# Patient Record
Sex: Male | Born: 1950 | Race: White | Hispanic: No | Marital: Married | Smoking: Never smoker
Health system: Southern US, Community
[De-identification: ages and names within clinical notes are randomized; demographics above are authoritative.]

## PROBLEM LIST (undated history)

## (undated) DIAGNOSIS — N135 Crossing vessel and stricture of ureter without hydronephrosis: Secondary | ICD-10-CM

## (undated) DIAGNOSIS — Z5189 Encounter for other specified aftercare: Secondary | ICD-10-CM

## (undated) DIAGNOSIS — M353 Polymyalgia rheumatica: Secondary | ICD-10-CM

## (undated) DIAGNOSIS — IMO0002 Reserved for concepts with insufficient information to code with codable children: Secondary | ICD-10-CM

## (undated) DIAGNOSIS — N200 Calculus of kidney: Secondary | ICD-10-CM

## (undated) DIAGNOSIS — N183 Chronic kidney disease, stage 3 unspecified: Secondary | ICD-10-CM

## (undated) HISTORY — DX: Calculus of kidney: N20.0

## (undated) HISTORY — DX: Crossing vessel and stricture of ureter without hydronephrosis: N13.5

## (undated) HISTORY — PX: OTHER SURGICAL HISTORY: SHX169

## (undated) HISTORY — DX: Chronic kidney disease, stage 3 unspecified: N18.30

## (undated) HISTORY — PX: KNEE SURGERY: SHX244

## (undated) HISTORY — PX: SHOULDER SURGERY: SHX246

## (undated) HISTORY — DX: Encounter for other specified aftercare: Z51.89

## (undated) HISTORY — DX: Chronic kidney disease, stage 3 (moderate): N18.3

## (undated) HISTORY — PX: HERNIA REPAIR: SHX51

## (undated) HISTORY — PX: URETER SURGERY: SHX823

## (undated) HISTORY — PX: COLONOSCOPY: SHX174

## (undated) HISTORY — DX: Reserved for concepts with insufficient information to code with codable children: IMO0002

## (undated) HISTORY — DX: Polymyalgia rheumatica: M35.3

## (undated) HISTORY — PX: TRIGGER FINGER RELEASE: SHX641

---

## 1998-04-30 ENCOUNTER — Encounter: Payer: Self-pay | Admitting: Urology

## 1998-05-03 ENCOUNTER — Ambulatory Visit (HOSPITAL_COMMUNITY): Admission: RE | Admit: 1998-05-03 | Discharge: 1998-05-03 | Payer: Self-pay | Admitting: Urology

## 2001-03-14 ENCOUNTER — Ambulatory Visit (HOSPITAL_COMMUNITY): Admission: RE | Admit: 2001-03-14 | Discharge: 2001-03-14 | Payer: Self-pay | Admitting: Urology

## 2001-03-14 ENCOUNTER — Encounter: Payer: Self-pay | Admitting: Urology

## 2001-08-16 ENCOUNTER — Encounter: Payer: Self-pay | Admitting: Rheumatology

## 2001-08-16 ENCOUNTER — Encounter: Admission: RE | Admit: 2001-08-16 | Discharge: 2001-08-16 | Payer: Self-pay | Admitting: Rheumatology

## 2004-05-01 ENCOUNTER — Emergency Department (HOSPITAL_COMMUNITY): Admission: EM | Admit: 2004-05-01 | Discharge: 2004-05-01 | Payer: Self-pay | Admitting: Emergency Medicine

## 2005-03-30 ENCOUNTER — Ambulatory Visit (HOSPITAL_COMMUNITY): Admission: RE | Admit: 2005-03-30 | Discharge: 2005-03-30 | Payer: Self-pay | Admitting: Urology

## 2005-09-04 LAB — HM COLONOSCOPY

## 2006-12-06 ENCOUNTER — Emergency Department (HOSPITAL_COMMUNITY): Admission: EM | Admit: 2006-12-06 | Discharge: 2006-12-06 | Payer: Self-pay | Admitting: Emergency Medicine

## 2006-12-10 ENCOUNTER — Encounter (HOSPITAL_COMMUNITY): Admission: RE | Admit: 2006-12-10 | Discharge: 2007-03-07 | Payer: Self-pay | Admitting: Emergency Medicine

## 2008-03-22 ENCOUNTER — Emergency Department (HOSPITAL_COMMUNITY): Admission: EM | Admit: 2008-03-22 | Discharge: 2008-03-22 | Payer: Self-pay | Admitting: Emergency Medicine

## 2009-08-30 ENCOUNTER — Encounter: Admission: RE | Admit: 2009-08-30 | Discharge: 2009-08-30 | Payer: Self-pay | Admitting: Rheumatology

## 2010-01-16 ENCOUNTER — Emergency Department (HOSPITAL_COMMUNITY): Admission: EM | Admit: 2010-01-16 | Discharge: 2010-01-16 | Payer: Self-pay | Admitting: Emergency Medicine

## 2011-01-20 NOTE — Consult Note (Signed)
NAME:  David Lawson, David Lawson                            ACCOUNT NO.:  [id number]   MEDICAL RECORD NO.:  [id number]                   PATIENT TYPE:  EMS   LOCATION:  ED                                   FACILITY:  [facility]   PHYSICIAN:  Maretta Bees. Vonita Moss, M.D.             DATE OF BIRTH:  25-Sep-1950   DATE OF CONSULTATION:  05/01/2004  DATE OF DISCHARGE:                                   CONSULTATION   This 60 year old white male is a patient of Dr. Vic Blackbird and has  had some recent problems with prostatitis and has been on Cipro.  He was  febrile and had a lot of dysuria earlier in the week.  Those symptoms are  clearing but he is having a very difficult time voiding at this time with  just small amounts and very small volumes and he has significant suprapubic  pressure and fullness.   Apparently he has had a history of mild renal insufficiency and that  actually may be a little bit worse since his wife said his kidney function  was 50% of normal.   He has a posterior history of gout.  Allopurinol is his only regular  medication although he is on Cipro at the present time.   ALLERGIES:  SULFA.   EXAMINATION:  He is alert and oriented but in distress, his lower abdomen is  distended, consistent with a palpable bladder.  Penis, urethra, meatus,  testes and epididymis without lesions.   He was catheterized for 1200 cc of urine.   IMPRESSION:  Acute urinary retention secondary to prostatitis.   PLAN:  He will go home with a Foley and remove it tomorrow morning.  Continue on Allopurinol and Cipro.  I will give him Vicodin for pain and  discomfort.  Will start him on Flomax 0.4 mg daily.   I told him to come to the office tomorrow if he cannot void after he takes  out his Foley but otherwise see Dr. Aldean Ast and follow up in the next week  or two.                                               Maretta Bees. Vonita Moss, M.D.    LJP/MEDQ  D:  05/01/2004  T:  05/02/2004  Job:  [id number]

## 2011-03-29 ENCOUNTER — Encounter: Payer: Self-pay | Admitting: Family Medicine

## 2011-03-29 DIAGNOSIS — M109 Gout, unspecified: Secondary | ICD-10-CM | POA: Insufficient documentation

## 2011-03-29 DIAGNOSIS — N200 Calculus of kidney: Secondary | ICD-10-CM | POA: Insufficient documentation

## 2011-03-29 DIAGNOSIS — N179 Acute kidney failure, unspecified: Secondary | ICD-10-CM | POA: Insufficient documentation

## 2011-03-29 DIAGNOSIS — N135 Crossing vessel and stricture of ureter without hydronephrosis: Secondary | ICD-10-CM | POA: Insufficient documentation

## 2013-07-29 ENCOUNTER — Other Ambulatory Visit: Payer: 59

## 2013-07-29 DIAGNOSIS — Z0000 Encounter for general adult medical examination without abnormal findings: Secondary | ICD-10-CM

## 2013-07-29 LAB — CBC WITH DIFFERENTIAL/PLATELET
HCT: 41.6 % (ref 39.0–52.0)
Hemoglobin: 14.1 g/dL (ref 13.0–17.0)
Lymphocytes Relative: 34 % (ref 12–46)
Lymphs Abs: 1.7 10*3/uL (ref 0.7–4.0)
MCV: 92.9 fL (ref 78.0–100.0)
Monocytes Absolute: 0.4 10*3/uL (ref 0.1–1.0)
Neutrophils Relative %: 55 % (ref 43–77)
RBC: 4.48 MIL/uL (ref 4.22–5.81)

## 2013-07-29 LAB — COMPREHENSIVE METABOLIC PANEL
ALT: 17 U/L (ref 0–53)
AST: 24 U/L (ref 0–37)
Albumin: 4.4 g/dL (ref 3.5–5.2)
Calcium: 9.7 mg/dL (ref 8.4–10.5)
Creat: 2.14 mg/dL — ABNORMAL HIGH (ref 0.50–1.35)
Total Bilirubin: 0.6 mg/dL (ref 0.3–1.2)
Total Protein: 6.4 g/dL (ref 6.0–8.3)

## 2013-07-29 LAB — LIPID PANEL
HDL: 38 mg/dL — ABNORMAL LOW (ref >39)
LDL Cholesterol: 102 mg/dL — ABNORMAL HIGH (ref 0–99)
Total CHOL/HDL Ratio: 4.2 Ratio
Triglycerides: 106 mg/dL (ref <150)

## 2013-07-29 LAB — PSA: PSA: 0.98 ng/mL (ref <=4.00)

## 2013-08-01 ENCOUNTER — Encounter: Payer: Self-pay | Admitting: Family Medicine

## 2013-08-01 ENCOUNTER — Encounter (INDEPENDENT_AMBULATORY_CARE_PROVIDER_SITE_OTHER): Payer: Self-pay

## 2013-08-01 ENCOUNTER — Ambulatory Visit (INDEPENDENT_AMBULATORY_CARE_PROVIDER_SITE_OTHER): Payer: 59 | Admitting: Family Medicine

## 2013-08-01 VITALS — BP 110/70 | HR 60 | Temp 97.1°F | Resp 14 | Ht 67.0 in | Wt 178.0 lb

## 2013-08-01 DIAGNOSIS — Z0000 Encounter for general adult medical examination without abnormal findings: Secondary | ICD-10-CM

## 2013-08-01 DIAGNOSIS — N183 Chronic kidney disease, stage 3 unspecified: Secondary | ICD-10-CM

## 2013-08-01 DIAGNOSIS — M109 Gout, unspecified: Secondary | ICD-10-CM

## 2013-08-01 MED ORDER — INDOMETHACIN 50 MG PO CAPS: 50.0000 mg | ORAL_CAPSULE | Freq: Two times a day (BID) | ORAL | Status: DC

## 2013-08-01 NOTE — Progress Notes (Signed)
Subjective:    Patient ID: David Lawson, male    DOB: [birth year], 62 y.o.   MRN: [id number]  HPI Patient is a very pleasant 62 year old white male who comes in today for complete physical exam. He has no complaints or concerns. His most recent labwork as listed below: Lab on 07/29/2013  Component Date Value Range Status  . WBC 07/29/2013 4.9  4.0 - 10.5 K/uL Final  . RBC 07/29/2013 4.48  4.22 - 5.81 MIL/uL Final  . Hemoglobin 07/29/2013 14.1  13.0 - 17.0 g/dL Final  . HCT 40/98/1191 41.6  39.0 - 52.0 % Final  . MCV 07/29/2013 92.9  78.0 - 100.0 fL Final  . MCH 07/29/2013 31.5  26.0 - 34.0 pg Final  . MCHC 07/29/2013 33.9  30.0 - 36.0 g/dL Final  . RDW 47/82/9562 13.5  11.5 - 15.5 % Final  . Platelets 07/29/2013 182  150 - 400 K/uL Final  . Neutrophils Relative % 07/29/2013 55  43 - 77 % Final  . Neutro Abs 07/29/2013 2.7  1.7 - 7.7 K/uL Final  . Lymphocytes Relative 07/29/2013 34  12 - 46 % Final  . Lymphs Abs 07/29/2013 1.7  0.7 - 4.0 K/uL Final  . Monocytes Relative 07/29/2013 8  3 - 12 % Final  . Monocytes Absolute 07/29/2013 0.4  0.1 - 1.0 K/uL Final  . Eosinophils Relative 07/29/2013 3  0 - 5 % Final  . Eosinophils Absolute 07/29/2013 0.2  0.0 - 0.7 K/uL Final  . Basophils Relative 07/29/2013 0  0 - 1 % Final  . Basophils Absolute 07/29/2013 0.0  0.0 - 0.1 K/uL Final  . Smear Review 07/29/2013 Criteria for review not met   Final  . Sodium 07/29/2013 143  135 - 145 mEq/L Final  . Potassium 07/29/2013 5.9* 3.5 - 5.3 mEq/L Final   No visible hemolysis.  . Chloride 07/29/2013 106  96 - 112 mEq/L Final  . CO2 07/29/2013 26  19 - 32 mEq/L Final  . Glucose, Bld 07/29/2013 96  70 - 99 mg/dL Final  . BUN 13/04/6577 23  6 - 23 mg/dL Final  . Creat 46/96/2952 2.14* 0.50 - 1.35 mg/dL Final  . Total Bilirubin 07/29/2013 0.6  0.3 - 1.2 mg/dL Final  . Alkaline Phosphatase 07/29/2013 75  39 - 117 U/L Final  . AST 07/29/2013 24  0 - 37 U/L Final  . ALT 07/29/2013 17  0 - 53 U/L Final  .  Total Protein 07/29/2013 6.4  6.0 - 8.3 g/dL Final  . Albumin 84/13/2440 4.4  3.5 - 5.2 g/dL Final  . Calcium 07/01/2535 9.7  8.4 - 10.5 mg/dL Final  . Cholesterol 64/40/3474 161  0 - 200 mg/dL Final   Comment: ATP III Classification:                                < 200        mg/dL        Desirable                               200 - 239     mg/dL        Borderline High                               >=  240        mg/dL        High                             . Triglycerides 07/29/2013 106  <150 mg/dL Final  . HDL 40/98/1191 38* >39 mg/dL Final  . Total CHOL/HDL Ratio 07/29/2013 4.2   Final  . VLDL 07/29/2013 21  0 - 40 mg/dL Final  . LDL Cholesterol 07/29/2013 102* 0 - 99 mg/dL Final   Comment:                            Total Cholesterol/HDL Ratio:CHD Risk                                                 Coronary Heart Disease Risk Table                                                                 Men       Women                                   1/2 Average Risk              3.4        3.3                                       Average Risk              5.0        4.4                                    2X Average Risk              9.6        7.1                                    3X Average Risk             23.4       11.0                          Use the calculated Patient Ratio above and the CHD Risk table                           to determine the patient's CHD Risk.                          ATP III Classification (LDL):                                <  100        mg/dL         Optimal                               100 - 129     mg/dL         Near or Above Optimal                               130 - 159     mg/dL         Borderline High                               160 - 189     mg/dL         High                                > 190        mg/dL         Very High                             . PSA 07/29/2013 0.98  <=4.00 ng/mL Final   Comment: Test Methodology: ECLIA PSA  (Electrochemiluminescence Immunoassay)                                                     For PSA values from 2.5-4.0, particularly in younger men <60 years                          old, the AUA and NCCN suggest testing for % Free PSA (3515) and                          evaluation of the rate of increase in PSA (PSA velocity).   patient's labs are excellent except for a creatinine that is elevated at 2.14 and potassium is elevated at 5.9. Upon further questioning the patient he had a history of ureteral obstruction as a child that required surgery. 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  He has since had chronic kidney disease. This caused gallop as an adult. In the past he was followed by urology but no one has been checking his creatinine since. His colonoscopy was in 2007. He had a tetanus shot 7 years ago. He refuses a flu shot today. He is due for the Zostavax. Past Medical History  Diagnosis Date  . PMR (polymyalgia rheumatica)   . DDD (degenerative disc disease)   . Acute renal failure   . Gout   . Nephrolithiasis   . Ureteral obstruction    No past surgical history on file. Current Outpatient Prescriptions on File Prior to Visit  Medication Sig Dispense Refill  . allopurinol (ZYLOPRIM) 100 MG tablet Take 200 mg by mouth daily.        Marland Kitchen glucosamine-chondroitin 500-400 MG tablet Take 1 tablet by mouth 3 (three) times daily.        Marland Kitchen  Multiple Vitamins-Minerals (MULTIVITAMIN,TX-MINERALS) tablet Take 1 tablet by mouth daily.         No current facility-administered medications on file prior to visit.   Allergies  Allergen Reactions  . Sulfa Antibiotics    History   Social History  . Marital Status: Married    Spouse Name: N/A    Number of Children: N/A  . Years of Education: N/A   Occupational History  . Not on file.   Social History Main Topics  . Smoking status: Never Smoker   . Smokeless tobacco: Not on file  . Alcohol  Use: No  . Drug Use: No  . Sexual Activity: Not on file   Other Topics Concern  . Not on file   Social History Narrative  . No narrative on file   Family history significant for father with coronary artery disease and a family history of coronary artery disease on his father's side.    Review of Systems  All other systems reviewed and are negative.       Objective:   Physical Exam  Vitals reviewed. Constitutional: He is oriented to person, place, and time. He appears well-developed and well-nourished. No distress.  HENT:  Head: Normocephalic and atraumatic.  Right Ear: External ear normal.  Left Ear: External ear normal.  Nose: Nose normal.  Mouth/Throat: Oropharynx is clear and moist. No oropharyngeal exudate.  Eyes: Conjunctivae and EOM are normal. Pupils are equal, round, and reactive to light. Right eye exhibits no discharge. Left eye exhibits no discharge. No scleral icterus.  Neck: Normal range of motion. Neck supple. No JVD present. No tracheal deviation present. No thyromegaly present.  Cardiovascular: Normal rate, regular rhythm, normal heart sounds and intact distal pulses.  Exam reveals no gallop and no friction rub.   No murmur heard. Pulmonary/Chest: Effort normal and breath sounds normal. No stridor. No respiratory distress. He has no wheezes. He has no rales. He exhibits no tenderness.  Abdominal: Soft. Bowel sounds are normal. He exhibits no distension and no mass. There is no tenderness. There is no rebound and no guarding.  Musculoskeletal: Normal range of motion. He exhibits no edema and no tenderness.  Lymphadenopathy:    He has no cervical adenopathy.  Neurological: He is alert and oriented to person, place, and time. He has normal reflexes. He displays normal reflexes. No cranial nerve deficit. He exhibits normal muscle tone. Coordination normal.  Skin: Skin is warm. No rash noted. He is not diaphoretic. No erythema. No pallor.  Psychiatric: He has a  normal mood and affect. His behavior is normal. Judgment and thought content normal.          Assessment & Plan:  1. Routine general medical examination at a health care facility Remainder of the patient's physical exam is completely normal. His preventive care is up to date. I recommended the Zostavax and gave the patient prescription to get his local pharmacy. I offered patient flu shot but he refused.  Patient is due for the pneumonia vaccine at age 24.  2. Gout I refilled the patient's Indocin with strict instructions not to take more than one to 2 times year due to his chronic kidney disease. He gets it more frequently we'll need to change his allopurinol to the lower - indomethacin (INDOCIN) 50 MG capsule; Take 1 capsule (50 mg total) by mouth 2 (two) times daily with a meal. prn  Dispense: 30 capsule; Refill: 0  3. CKD (chronic kidney disease), stage III Check a renal  ultrasound. If the renal ultrasound confirms chronic kidney disease from medical renal issues, I would check his creatinine every 6 months. - US Renal; Future

## 2013-08-06 ENCOUNTER — Other Ambulatory Visit: Payer: Self-pay

## 2013-08-06 ENCOUNTER — Encounter: Payer: Self-pay | Admitting: Family Medicine

## 2013-08-06 NOTE — Telephone Encounter (Signed)
This encounter was created in error - please disregard.

## 2013-08-06 NOTE — Telephone Encounter (Signed)
David Lawson from [facility] called and stated that the pt cancelled his apt for today bc of scheduling issues and will call back and schedule his apt

## 2013-08-07 ENCOUNTER — Encounter: Payer: Self-pay | Admitting: *Deleted

## 2013-08-15 ENCOUNTER — Ambulatory Visit
Admission: RE | Admit: 2013-08-15 | Discharge: 2013-08-15 | Disposition: A | Discharge status: Home / Self Care | Payer: 59 | Source: Ambulatory Visit | Attending: Family Medicine | Admitting: Family Medicine

## 2013-08-15 DIAGNOSIS — N183 Chronic kidney disease, stage 3 unspecified: Secondary | ICD-10-CM

## 2013-08-19 ENCOUNTER — Other Ambulatory Visit: Payer: Self-pay | Admitting: Family Medicine

## 2013-08-19 DIAGNOSIS — M109 Gout, unspecified: Secondary | ICD-10-CM

## 2013-08-19 MED ORDER — INDOMETHACIN 50 MG PO CAPS: 50.0000 mg | ORAL_CAPSULE | Freq: Two times a day (BID) | ORAL | Status: DC

## 2014-06-04 ENCOUNTER — Ambulatory Visit (INDEPENDENT_AMBULATORY_CARE_PROVIDER_SITE_OTHER): Payer: 59 | Admitting: Family Medicine

## 2014-06-04 ENCOUNTER — Encounter: Payer: Self-pay | Admitting: Family Medicine

## 2014-06-04 VITALS — BP 100/64 | HR 62 | Temp 98.2°F | Resp 16 | Ht 67.0 in | Wt 181.0 lb

## 2014-06-04 DIAGNOSIS — B354 Tinea corporis: Secondary | ICD-10-CM

## 2014-06-04 DIAGNOSIS — M722 Plantar fascial fibromatosis: Secondary | ICD-10-CM

## 2014-06-04 MED ORDER — FLUCONAZOLE 150 MG PO TABS: 150.0000 mg | ORAL_TABLET | ORAL | Status: DC

## 2014-06-04 NOTE — Progress Notes (Signed)
   Subjective:    Patient ID: David Lawson, male    DOB: [birth year], 63 y.o.   MRN: [id number]  HPI  Patient has a large red rash in his pubic area. It is proximally 5 cm in diameter. It has a raised red border with central clearing and white scale. It looks to be ringworm. He's been trying Goldbond powder and talcum powder without benefit. He states that the rash itches. Patient also has had pain in his right foot near the insertion of the plantar fashion on his right heel for approximately one month. He denies any specific injury. The pain is worse in the morning first 5 steps that he takes and then improves as he walks on the heel. He is tender to palpation on the plantar aspect of the calcaneus near the insertion of the plantar fashia. Past Medical History  Diagnosis Date  . PMR (polymyalgia rheumatica)   . DDD (degenerative disc disease)   . Acute renal failure   . Gout   . Nephrolithiasis   . Ureteral obstruction    No past surgical history on file. Current Outpatient Prescriptions on File Prior to Visit  Medication Sig Dispense Refill  . allopurinol (ZYLOPRIM) 100 MG tablet Take 200 mg by mouth daily.        Marland Kitchen glucosamine-chondroitin 500-400 MG tablet Take 1 tablet by mouth 3 (three) times daily.        . indomethacin (INDOCIN) 50 MG capsule Take 1 capsule (50 mg total) by mouth 2 (two) times daily with a meal. prn  30 capsule  0  . Multiple Vitamins-Minerals (MULTIVITAMIN,TX-MINERALS) tablet Take 1 tablet by mouth daily.         No current facility-administered medications on file prior to visit.   Allergies  Allergen Reactions  . Sulfa Antibiotics    History   Social History  . Marital Status: Married    Spouse Name: N/A    Number of Children: N/A  . Years of Education: N/A   Occupational History  . Not on file.   Social History Main Topics  . Smoking status: Never Smoker   . Smokeless tobacco: Not on file  . Alcohol Use: No  . Drug Use: No  . Sexual Activity:  Not on file   Other Topics Concern  . Not on file   Social History Narrative  . No narrative on file     Review of Systems  All other systems reviewed and are negative.      Objective:   Physical Exam  Vitals reviewed. Cardiovascular: Normal rate and regular rhythm.   Pulmonary/Chest: Effort normal and breath sounds normal.  Musculoskeletal: He exhibits tenderness.       Right foot: He exhibits bony tenderness.  Skin: Rash noted. There is erythema.          Assessment & Plan:  Tinea corporis  Plantar fasciitis of right foot   Begin Lotrimin cream twice a day for the next 2-4 weeks. The patient would also like to expedite by taking Diflucan 150 mg by mouth every other day for 2 weeks. Recheck in 2 weeks if no better or sooner if worse. I believe the pain in his foot is due to plantar fasciitis. I recommended ibuprofen for the next 2 weeks. Also gave the patient a series of stretches I wanted to perform at home to try to help the pain. Also recommended over-the-counter arch supports.Marland Kitchen

## 2015-03-25 ENCOUNTER — Encounter: Payer: Self-pay | Admitting: Family Medicine

## 2015-03-25 ENCOUNTER — Ambulatory Visit (INDEPENDENT_AMBULATORY_CARE_PROVIDER_SITE_OTHER): Payer: 59 | Admitting: Family Medicine

## 2015-03-25 VITALS — BP 110/68 | HR 60 | Temp 97.9°F | Resp 14 | Ht 67.0 in | Wt 176.0 lb

## 2015-03-25 DIAGNOSIS — M1000 Idiopathic gout, unspecified site: Secondary | ICD-10-CM

## 2015-03-25 DIAGNOSIS — N183 Chronic kidney disease, stage 3 (moderate): Secondary | ICD-10-CM | POA: Diagnosis not present

## 2015-03-25 DIAGNOSIS — Z13220 Encounter for screening for lipoid disorders: Secondary | ICD-10-CM | POA: Diagnosis not present

## 2015-03-25 MED ORDER — CIPROFLOXACIN HCL 500 MG PO TABS: 500.0000 mg | ORAL_TABLET | Freq: Two times a day (BID) | ORAL | Status: DC

## 2015-03-25 MED ORDER — PREDNISONE 20 MG PO TABS: ORAL_TABLET | ORAL | Status: DC

## 2015-03-25 MED ORDER — INDOMETHACIN 50 MG PO CAPS: 50.0000 mg | ORAL_CAPSULE | Freq: Two times a day (BID) | ORAL | Status: DC

## 2015-03-25 NOTE — Progress Notes (Signed)
Subjective:    Patient ID: David Lawson, male    DOB: 1950-11-11, 64 y.o.   MRN: [id number]  HPI Patient is here today requesting a refill on Indocin. Patient uses 1-2 Indocin pills every 12 months for rare gout exacerbations. However he has chronic kidney disease stage III due to kidney damage she suffered as a child. He is also overdue for a PSA as well as a cholesterol level. Patient is also requesting a refill on Cipro that he uses maybe once every 2 years for rare prostate infection. Patient has had a shingles vaccine. His colonoscopy is not due until next year. Past Medical History  Diagnosis Date  . PMR (polymyalgia rheumatica)   . DDD (degenerative disc disease)   . Acute renal failure   . Gout   . Nephrolithiasis   . Ureteral obstruction    No past surgical history on file. Current Outpatient Prescriptions on File Prior to Visit  Medication Sig Dispense Refill  . allopurinol (ZYLOPRIM) 100 MG tablet Take 200 mg by mouth daily.      Marland Kitchen glucosamine-chondroitin 500-400 MG tablet Take 1 tablet by mouth 3 (three) times daily.      . Multiple Vitamins-Minerals (MULTIVITAMIN,TX-MINERALS) tablet Take 1 tablet by mouth daily.       No current facility-administered medications on file prior to visit.   Allergies  Allergen Reactions  . Sulfa Antibiotics    History   Social History  . Marital Status: Married    Spouse Name: N/A  . Number of Children: N/A  . Years of Education: N/A   Occupational History  . Not on file.   Social History Main Topics  . Smoking status: Never Smoker   . Smokeless tobacco: Not on file  . Alcohol Use: No  . Drug Use: No  . Sexual Activity: Not on file   Other Topics Concern  . Not on file   Social History Narrative      Review of Systems  All other systems reviewed and are negative.      Objective:   Physical Exam  Constitutional: He appears well-developed and well-nourished.  Neck: Neck supple. No JVD present. No thyromegaly  present.  Cardiovascular: Normal rate, regular rhythm and normal heart sounds.   No murmur heard. Pulmonary/Chest: Effort normal and breath sounds normal. No respiratory distress. He has no wheezes. He has no rales.  Abdominal: Soft. Bowel sounds are normal. He exhibits no distension. There is no tenderness. There is no rebound and no guarding.  Musculoskeletal: He exhibits no edema.  Lymphadenopathy:    He has no cervical adenopathy.  Vitals reviewed.         Assessment & Plan:  Idiopathic gout, unspecified chronicity, unspecified site - Plan: predniSONE (DELTASONE) 20 MG tablet, indomethacin (INDOCIN) 50 MG capsule  Screening for cholesterol level - Plan: COMPLETE METABOLIC PANEL WITH GFR, Lipid panel  CKD (chronic kidney disease), stage 3 (moderate) - Plan: CBC with Differential/Platelet, PSA  Blood pressures well controlled. I have asked the patient to return fasting for a CMP as well as fasting lipid panel. I explained to the patient I want him to try to avoid using Indocin because of his chronic kidney disease. I would actually rather have the patient take prednisone for gout exacerbation. Offer the patient switching allopurinol to Uloric but the patient declined. I did give the patient a refill on Cipro on the off chance he gets a bladder infection while he is [place] of [place].

## 2015-03-30 ENCOUNTER — Other Ambulatory Visit: Payer: 59

## 2015-05-06 ENCOUNTER — Other Ambulatory Visit: Payer: 59

## 2015-05-06 DIAGNOSIS — N183 Chronic kidney disease, stage 3 (moderate): Secondary | ICD-10-CM

## 2015-05-06 DIAGNOSIS — Z13220 Encounter for screening for lipoid disorders: Secondary | ICD-10-CM

## 2015-05-06 LAB — COMPLETE METABOLIC PANEL WITH GFR
ALT: 18 U/L (ref 9–46)
AST: 19 U/L (ref 10–35)
Albumin: 4.1 g/dL (ref 3.6–5.1)
Alkaline Phosphatase: 80 U/L (ref 40–115)
BILIRUBIN TOTAL: 0.5 mg/dL (ref 0.2–1.2)
BUN: 34 mg/dL — ABNORMAL HIGH (ref 7–25)
CO2: 21 mmol/L (ref 20–31)
CREATININE: 2.01 mg/dL — AB (ref 0.70–1.25)
Calcium: 9.3 mg/dL (ref 8.6–10.3)
Chloride: 107 mmol/L (ref 98–110)
GFR, Est African American: 40 mL/min — ABNORMAL LOW (ref >=60)
GFR, Est Non African American: 34 mL/min — ABNORMAL LOW (ref >=60)
GLUCOSE: 116 mg/dL — AB (ref 70–99)
Potassium: 5.6 mmol/L — ABNORMAL HIGH (ref 3.5–5.3)
SODIUM: 137 mmol/L (ref 135–146)
TOTAL PROTEIN: 6.3 g/dL (ref 6.1–8.1)

## 2015-05-06 LAB — CBC WITH DIFFERENTIAL/PLATELET
BASOS ABS: 0 10*3/uL (ref 0.0–0.1)
BASOS PCT: 0 % (ref 0–1)
EOS ABS: 0 10*3/uL (ref 0.0–0.7)
Eosinophils Relative: 0 % (ref 0–5)
HCT: 42.7 % (ref 39.0–52.0)
Hemoglobin: 14.5 g/dL (ref 13.0–17.0)
LYMPHS ABS: 1.5 10*3/uL (ref 0.7–4.0)
Lymphocytes Relative: 13 % (ref 12–46)
MCH: 31.8 pg (ref 26.0–34.0)
MCHC: 34 g/dL (ref 30.0–36.0)
MCV: 93.6 fL (ref 78.0–100.0)
MPV: 9.9 fL (ref 8.6–12.4)
Monocytes Absolute: 0.6 10*3/uL (ref 0.1–1.0)
Monocytes Relative: 5 % (ref 3–12)
NEUTROS PCT: 82 % — AB (ref 43–77)
Neutro Abs: 9.7 10*3/uL — ABNORMAL HIGH (ref 1.7–7.7)
PLATELETS: 226 10*3/uL (ref 150–400)
RBC: 4.56 MIL/uL (ref 4.22–5.81)
RDW: 14.1 % (ref 11.5–15.5)
WBC: 11.8 10*3/uL — ABNORMAL HIGH (ref 4.0–10.5)

## 2015-05-06 LAB — LIPID PANEL
Cholesterol: 163 mg/dL (ref 125–200)
HDL: 41 mg/dL (ref >=40)
LDL CALC: 112 mg/dL (ref <130)
Total CHOL/HDL Ratio: 4 Ratio (ref <=5.0)
Triglycerides: 49 mg/dL (ref <150)
VLDL: 10 mg/dL (ref <30)

## 2015-05-07 LAB — PSA: PSA: 1.43 ng/mL (ref <=4.00)

## 2015-08-02 ENCOUNTER — Telehealth: Payer: Self-pay | Admitting: Family Medicine

## 2015-08-02 ENCOUNTER — Other Ambulatory Visit: Payer: Self-pay | Admitting: Family Medicine

## 2015-08-02 MED ORDER — ALLOPURINOL 100 MG PO TABS: 200.0000 mg | ORAL_TABLET | Freq: Every day | ORAL | Status: DC

## 2015-08-02 NOTE — Telephone Encounter (Signed)
Patient needs refill on allopurinol 100 mg  [facility] [phone]

## 2015-08-02 NOTE — Telephone Encounter (Signed)
Medication called/sent to requested pharmacy  

## 2015-11-19 ENCOUNTER — Encounter: Payer: Self-pay | Admitting: Family Medicine

## 2015-11-19 ENCOUNTER — Ambulatory Visit (INDEPENDENT_AMBULATORY_CARE_PROVIDER_SITE_OTHER): Payer: BLUE CROSS/BLUE SHIELD | Admitting: Family Medicine

## 2015-11-19 VITALS — BP 120/68 | HR 100 | Temp 100.6°F | Resp 16 | Ht 67.0 in | Wt 180.0 lb

## 2015-11-19 DIAGNOSIS — R509 Fever, unspecified: Secondary | ICD-10-CM

## 2015-11-19 DIAGNOSIS — J101 Influenza due to other identified influenza virus with other respiratory manifestations: Secondary | ICD-10-CM | POA: Diagnosis not present

## 2015-11-19 LAB — INFLUENZA A AND B AG, IMMUNOASSAY
INFLUENZA A ANTIGEN: NOT DETECTED
Influenza B Antigen: DETECTED — AB

## 2015-11-19 MED ORDER — OSELTAMIVIR PHOSPHATE 75 MG PO CAPS: 75.0000 mg | ORAL_CAPSULE | Freq: Two times a day (BID) | ORAL | Status: DC

## 2015-11-19 NOTE — Progress Notes (Signed)
   Subjective:    Patient ID: David Lawson, male    DOB: 08-May-1951, 65 y.o.   MRN: [id number]  HPI  Symptoms began in earnest 3 days ago with diffuse body aches, fever, fatigue, mild cough, mild sore throat, mild rhinorrhea. The body aches and fatigue however her severe. The patient has no energy. His flu test today upon my evaluation appears faintly positive for influenza B Past Medical History  Diagnosis Date  . PMR (polymyalgia rheumatica) (HCC)   . DDD (degenerative disc disease)   . Acute renal failure ([place])   . Gout   . Nephrolithiasis   . Ureteral obstruction    No past surgical history on file. Current Outpatient Prescriptions on File Prior to Visit  Medication Sig Dispense Refill  . allopurinol (ZYLOPRIM) 100 MG tablet Take 2 tablets (200 mg total) by mouth daily. 180 tablet 3  . glucosamine-chondroitin 500-400 MG tablet Take 1 tablet by mouth 3 (three) times daily.      . indomethacin (INDOCIN) 50 MG capsule Take 1 capsule (50 mg total) by mouth 2 (two) times daily with a meal. Prn Gout flare 30 capsule 0  . Multiple Vitamins-Minerals (MULTIVITAMIN,TX-MINERALS) tablet Take 1 tablet by mouth daily.       No current facility-administered medications on file prior to visit.   Allergies  Allergen Reactions  . Sulfa Antibiotics    Social History   Social History  . Marital Status: Married    Spouse Name: N/A  . Number of Children: N/A  . Years of Education: N/A   Occupational History  . Not on file.   Social History Main Topics  . Smoking status: Never Smoker   . Smokeless tobacco: Not on file  . Alcohol Use: No  . Drug Use: No  . Sexual Activity: Not on file   Other Topics Concern  . Not on file   Social History Narrative      Review of Systems  All other systems reviewed and are negative.      Objective:   Physical Exam  Constitutional: He appears well-developed and well-nourished.  Neck: Neck supple. No JVD present. No thyromegaly present.    Cardiovascular: Normal rate, regular rhythm and normal heart sounds.   No murmur heard. Pulmonary/Chest: Effort normal and breath sounds normal. No respiratory distress. He has no wheezes. He has no rales.  Abdominal: Soft. Bowel sounds are normal. He exhibits no distension. There is no tenderness. There is no rebound and no guarding.  Musculoskeletal: He exhibits no edema.  Lymphadenopathy:    He has no cervical adenopathy.  Vitals reviewed.         Assessment & Plan:  Fever, unspecified - Plan: Influenza A and B Ag, Immunoassay  Influenza B - Plan: oseltamivir (TAMIFLU) 75 MG capsule, DISCONTINUED: oseltamivir (TAMIFLU) 75 MG capsule  Begin Tamiflu 75 mg by mouth twice a day for 5 days. Also gave him a refill that he can get his wife should she develop symptoms.

## 2015-11-23 ENCOUNTER — Telehealth: Payer: Self-pay | Admitting: Family Medicine

## 2015-11-23 NOTE — Telephone Encounter (Signed)
Tamiflu and Cipro would be fine together

## 2015-11-23 NOTE — Telephone Encounter (Signed)
Patients wife pam calling to ask some questions about patient having a uti, cipro with tamiflu, and him feeling really bad  [phone]

## 2015-11-23 NOTE — Telephone Encounter (Signed)
Called and spoke to wife and she states that pt has developed a UTI and has started taking Cipro along with the Tamiflu and she wants to make sure that is ok with his CKD? Also because of his CDK is it ok to continue to drink Gatorade with the his other fluids? Also he was doing good until yesterday when he developed a UTI and has since started running another fever of 99.0 with tylenol.

## 2015-11-24 NOTE — Telephone Encounter (Signed)
Pt's wife aware.

## 2015-11-30 ENCOUNTER — Telehealth: Payer: Self-pay | Admitting: *Deleted

## 2015-11-30 MED ORDER — CIPROFLOXACIN HCL 500 MG PO TABS: ORAL_TABLET | ORAL | Status: DC

## 2015-11-30 NOTE — Telephone Encounter (Signed)
Script sent to pharmacy, pe aware

## 2015-11-30 NOTE — Telephone Encounter (Signed)
Ok with refill 

## 2015-11-30 NOTE — Telephone Encounter (Signed)
Pt called he was prescribe cipro for UTI on 11/19/15 and stated he has taken his last pill this am, says he still has the symptoms and wants to know if he can get another 10 day round? Please advise?  [facility]  Call back number [phone] ext [phone]

## 2015-12-29 ENCOUNTER — Other Ambulatory Visit (HOSPITAL_COMMUNITY): Payer: Self-pay | Admitting: Orthopedic Surgery

## 2015-12-29 DIAGNOSIS — M65342 Trigger finger, left ring finger: Secondary | ICD-10-CM | POA: Diagnosis not present

## 2015-12-29 DIAGNOSIS — M25511 Pain in right shoulder: Secondary | ICD-10-CM

## 2016-01-12 ENCOUNTER — Ambulatory Visit (HOSPITAL_COMMUNITY)
Admission: RE | Admit: 2016-01-12 | Discharge: 2016-01-12 | Disposition: A | Discharge status: Home / Self Care | Payer: BLUE CROSS/BLUE SHIELD | Source: Ambulatory Visit | Attending: Orthopedic Surgery | Admitting: Orthopedic Surgery

## 2016-01-12 DIAGNOSIS — M7581 Other shoulder lesions, right shoulder: Secondary | ICD-10-CM | POA: Diagnosis not present

## 2016-01-12 DIAGNOSIS — M659 Synovitis and tenosynovitis, unspecified: Secondary | ICD-10-CM | POA: Insufficient documentation

## 2016-01-12 DIAGNOSIS — M25511 Pain in right shoulder: Secondary | ICD-10-CM | POA: Diagnosis not present

## 2016-01-12 DIAGNOSIS — M75101 Unspecified rotator cuff tear or rupture of right shoulder, not specified as traumatic: Secondary | ICD-10-CM | POA: Diagnosis not present

## 2016-01-12 MED ORDER — GADOBENATE DIMEGLUMINE 529 MG/ML IV SOLN
5.0000 mL | Freq: Once | INTRAVENOUS | Status: AC | PRN
  Administered 2016-01-12: 0.05 mL via INTRA_ARTICULAR

## 2016-01-12 MED ORDER — IOHEXOL 180 MG/ML  SOLN
20.0000 mL | Freq: Once | INTRAMUSCULAR | Status: AC | PRN
  Administered 2016-01-12: 10 mL via INTRA_ARTICULAR

## 2016-01-12 MED ORDER — LIDOCAINE HCL (PF) 1 % IJ SOLN
INTRAMUSCULAR | Status: AC
  Administered 2016-01-12: 5 mL via INTRAMUSCULAR
  Filled 2016-01-12: qty 5

## 2016-01-19 DIAGNOSIS — M19011 Primary osteoarthritis, right shoulder: Secondary | ICD-10-CM | POA: Diagnosis not present

## 2016-05-24 DIAGNOSIS — M65331 Trigger finger, right middle finger: Secondary | ICD-10-CM | POA: Diagnosis not present

## 2016-06-26 DIAGNOSIS — M65342 Trigger finger, left ring finger: Secondary | ICD-10-CM | POA: Diagnosis not present

## 2016-07-05 ENCOUNTER — Ambulatory Visit (INDEPENDENT_AMBULATORY_CARE_PROVIDER_SITE_OTHER): Payer: BLUE CROSS/BLUE SHIELD | Admitting: Orthopedic Surgery

## 2016-07-05 ENCOUNTER — Encounter (INDEPENDENT_AMBULATORY_CARE_PROVIDER_SITE_OTHER): Payer: Self-pay | Admitting: Orthopedic Surgery

## 2016-07-05 DIAGNOSIS — M65342 Trigger finger, left ring finger: Secondary | ICD-10-CM

## 2016-07-05 NOTE — Progress Notes (Signed)
   Post-Op Visit Note   Patient: David Lawson           Date of Birth: 1951/04/25           MRN: [id number] Visit Date: 07/05/2016 PCP: Jenna Luo TOM, MD   Patient is here s/p left fourth trigger finger release on 06/26/16.  Doing well, incision is clean and dry.  He has been limiting his activity.  He is not taking any medication for this.   Assessment & Plan:  Chief Complaint:  Chief Complaint  Patient presents with  . Left Ring Finger - Routine Post Op   Visit Diagnoses: No diagnosis found.  Plan: Resume all following left fourth trigger finger release incision intact fingers are triggering motion is good plan is to DC the sutures tear strip with benzoin follow-up with me as needed  Follow-Up Instructions: No Follow-up on file.   Orders:  No orders of the defined types were placed in this encounter.  No orders of the defined types were placed in this encounter.    PMFS History: Patient Active Problem List   Diagnosis Date Noted  . Acute renal failure ([place])   . Gout   . Nephrolithiasis   . Ureteral obstruction    Past Medical History:  Diagnosis Date  . Acute renal failure ([place])   . DDD (degenerative disc disease)   . Gout   . Nephrolithiasis   . PMR (polymyalgia rheumatica) (HCC)   . Ureteral obstruction     No family history on file.  No past surgical history on file. Social History   Occupational History  . Not on file.   Social History Main Topics  . Smoking status: Never Smoker  . Smokeless tobacco: Not on file  . Alcohol use No  . Drug use: No  . Sexual activity: Not on file

## 2016-09-10 ENCOUNTER — Other Ambulatory Visit: Payer: Self-pay | Admitting: Family Medicine

## 2016-10-10 ENCOUNTER — Ambulatory Visit (INDEPENDENT_AMBULATORY_CARE_PROVIDER_SITE_OTHER): Payer: BLUE CROSS/BLUE SHIELD | Admitting: Family Medicine

## 2016-10-10 ENCOUNTER — Encounter: Payer: Self-pay | Admitting: Family Medicine

## 2016-10-10 VITALS — BP 118/74 | HR 58 | Temp 97.7°F | Resp 16 | Ht 67.0 in | Wt 181.0 lb

## 2016-10-10 DIAGNOSIS — L570 Actinic keratosis: Secondary | ICD-10-CM | POA: Diagnosis not present

## 2016-10-10 DIAGNOSIS — D485 Neoplasm of uncertain behavior of skin: Secondary | ICD-10-CM

## 2016-10-10 DIAGNOSIS — N183 Chronic kidney disease, stage 3 unspecified: Secondary | ICD-10-CM

## 2016-10-10 LAB — CBC WITH DIFFERENTIAL/PLATELET
BASOS ABS: 0 {cells}/uL (ref 0–200)
Basophils Relative: 0 %
EOS ABS: 92 {cells}/uL (ref 15–500)
Eosinophils Relative: 2 %
HCT: 43.7 % (ref 38.5–50.0)
Hemoglobin: 14.5 g/dL (ref 13.0–17.0)
LYMPHS PCT: 34 %
Lymphs Abs: 1564 cells/uL (ref 850–3900)
MCH: 32 pg (ref 27.0–33.0)
MCHC: 33.2 g/dL (ref 32.0–36.0)
MCV: 96.5 fL (ref 80.0–100.0)
MONOS PCT: 10 %
MPV: 10.2 fL (ref 7.5–12.5)
Monocytes Absolute: 460 cells/uL (ref 200–950)
NEUTROS ABS: 2484 {cells}/uL (ref 1500–7800)
Neutrophils Relative %: 54 %
Platelets: 161 10*3/uL (ref 140–400)
RBC: 4.53 MIL/uL (ref 4.20–5.80)
RDW: 13.5 % (ref 11.0–15.0)
WBC: 4.6 10*3/uL (ref 3.8–10.8)

## 2016-10-10 LAB — BASIC METABOLIC PANEL WITH GFR
BUN: 23 mg/dL (ref 7–25)
CHLORIDE: 107 mmol/L (ref 98–110)
CO2: 28 mmol/L (ref 20–31)
CREATININE: 1.87 mg/dL — AB (ref 0.70–1.25)
Calcium: 9.1 mg/dL (ref 8.6–10.3)
GFR, Est African American: 43 mL/min — ABNORMAL LOW (ref >=60)
GFR, Est Non African American: 37 mL/min — ABNORMAL LOW (ref >=60)
Glucose, Bld: 36 mg/dL — CL (ref 70–99)
Potassium: 4.4 mmol/L (ref 3.5–5.3)
Sodium: 143 mmol/L (ref 135–146)

## 2016-10-10 LAB — URIC ACID: URIC ACID, SERUM: 7.6 mg/dL (ref 4.0–8.0)

## 2016-10-10 MED ORDER — ALLOPURINOL 100 MG PO TABS: 200.0000 mg | ORAL_TABLET | Freq: Every day | ORAL | 3 refills | Status: DC

## 2016-10-10 NOTE — Progress Notes (Signed)
Subjective:    Patient ID: David Lawson, male    DOB: [birth year], 66 y.o.   MRN: [id number]  HPI Patient is a very pleasant 66 year old Caucasian male.   he states that he has had this lesion on the posterior aspect of his right shoulder for quite some time. Recently it became irritated it started to bleed. On examination there is approximately a 1 cm brown hyperkeratotic papule with surrounding erythema and central telangiectasias. It appears to be an irritated seborrheic keratosis although I cannot rule out squamous cell carcinoma. He would like this biopsy. He also requests a refill on his allopurinol and he is overdue to check his renal function test given his chronic kidney disease. His blood pressure today is excellent  Past Medical History:  Diagnosis Date  . CKD (chronic kidney disease) stage 3, GFR 30-59 ml/min   . DDD (degenerative disc disease)   . Gout   . Nephrolithiasis   . PMR (polymyalgia rheumatica) (HCC)   . Ureteral obstruction    No past surgical history on file. Current Outpatient Prescriptions on File Prior to Visit  Medication Sig Dispense Refill  . glucosamine-chondroitin 500-400 MG tablet Take 1 tablet by mouth 3 (three) times daily.      . indomethacin (INDOCIN) 50 MG capsule Take 1 capsule (50 mg total) by mouth 2 (two) times daily with a meal. Prn Gout flare 30 capsule 0  . Multiple Vitamins-Minerals (MULTIVITAMIN,TX-MINERALS) tablet Take 1 tablet by mouth daily.      . niacin 500 MG tablet Take 500 mg by mouth 2 (two) times daily with a meal.     No current facility-administered medications on file prior to visit.    Allergies  Allergen Reactions  . Sulfa Antibiotics    Social History   Social History  . Marital status: Married    Spouse name: N/A  . Number of children: N/A  . Years of education: N/A   Occupational History  . Not on file.   Social History Main Topics  . Smoking status: Never Smoker  . Smokeless tobacco: Not on file  . Alcohol  use No  . Drug use: No  . Sexual activity: Not on file   Other Topics Concern  . Not on file   Social History Narrative  . No narrative on file    Past Medical History:  Diagnosis Date  . Acute renal failure ([place])   . DDD (degenerative disc disease)   . Gout   . Nephrolithiasis   . PMR (polymyalgia rheumatica) (HCC)   . Ureteral obstruction    No past surgical history on file. Current Outpatient Prescriptions on File Prior to Visit  Medication Sig Dispense Refill  . allopurinol (ZYLOPRIM) 100 MG tablet TAKE 2 TABLETS BY MOUTH EVERY DAY 180 tablet 0  . glucosamine-chondroitin 500-400 MG tablet Take 1 tablet by mouth 3 (three) times daily.      . indomethacin (INDOCIN) 50 MG capsule Take 1 capsule (50 mg total) by mouth 2 (two) times daily with a meal. Prn Gout flare 30 capsule 0  . Multiple Vitamins-Minerals (MULTIVITAMIN,TX-MINERALS) tablet Take 1 tablet by mouth daily.      . niacin 500 MG tablet Take 500 mg by mouth 2 (two) times daily with a meal.     No current facility-administered medications on file prior to visit.    Allergies  Allergen Reactions  . Sulfa Antibiotics    Social History   Social History  . Marital status: Married  Spouse name: N/A  . Number of children: N/A  . Years of education: N/A   Occupational History  . Not on file.   Social History Main Topics  . Smoking status: Never Smoker  . Smokeless tobacco: Not on file  . Alcohol use No  . Drug use: No  . Sexual activity: Not on file   Other Topics Concern  . Not on file   Social History Narrative  . No narrative on file     Review of Systems  All other systems reviewed and are negative.      Objective:   Physical Exam  Constitutional: He appears well-developed and well-nourished. No distress.  Cardiovascular: Normal rate, regular rhythm and normal heart sounds.   Pulmonary/Chest: Effort normal and breath sounds normal. No respiratory distress. He has no wheezes. He has no  rales.  Skin: He is not diaphoretic.  Vitals reviewed.         Assessment & Plan:  CKD (chronic kidney disease) stage 3, GFR 30-59 ml/min - Plan: CBC with Differential/Platelet, BASIC METABOLIC PANEL WITH GFR, Uric acid  Neoplasm of uncertain behavior of skin - Plan: Pathology I believe the lesion on the posterior aspect of his right shoulder is likely an irritated seborrheic keratosis. I cannot rule out squamous cell carcinoma. Using sterile technique, I anesthetized the lesion was 0.1% lidocaine with epinephrine. The lesion was then removed in its entirety using a shave biopsy technique. Lesion was sent to pathology and labeled container. Hemostasis was achieved with Drysol. While the patient is here, I refilled his allopurinol and also check lab work to monitor his chronic kidney disease including a CBC, BMP, and uric acid level.

## 2016-10-11 LAB — PATHOLOGY

## 2016-10-16 ENCOUNTER — Encounter: Payer: Self-pay | Admitting: Family Medicine

## 2016-10-26 ENCOUNTER — Encounter (INDEPENDENT_AMBULATORY_CARE_PROVIDER_SITE_OTHER): Payer: Self-pay | Admitting: Orthopedic Surgery

## 2016-10-26 ENCOUNTER — Ambulatory Visit (INDEPENDENT_AMBULATORY_CARE_PROVIDER_SITE_OTHER): Payer: BLUE CROSS/BLUE SHIELD | Admitting: Orthopedic Surgery

## 2016-10-26 DIAGNOSIS — M65321 Trigger finger, right index finger: Secondary | ICD-10-CM | POA: Diagnosis not present

## 2016-10-26 DIAGNOSIS — M65342 Trigger finger, left ring finger: Secondary | ICD-10-CM | POA: Diagnosis not present

## 2016-10-26 NOTE — Progress Notes (Signed)
Office Visit Note   Patient: David Lawson           Date of Birth: [birth year]           MRN: [id number] Visit Date: 10/26/2016 Requested by: Susy Frizzle, MD [address] [facility] [place], [place] [address] PCP: Odette Fraction, MD  Subjective: Chief Complaint  Patient presents with  . Right Index Finger - Pain    HPI raises 66 year old patient right-hand-dominant with right index trigger finger.  He doesn't catch as bad as the other trigger fingers did prior to release but it is very painful.  Been on for several months.  He requests an injection today.              Review of Systems All systems reviewed are negative as they relate to the chief complaint within the history of present illness.  Patient denies  fevers or chills.    Assessment & Plan: Visit Diagnoses:  1. Trigger finger, right index finger     Plan: Impression is right index trigger finger which is more painful than others and then in the past.  Ultrasound-guided injection is performed today into the A1 tendon sheath region.  We'll see how he does with this injection.  He may need surgical release on this trigger finger in the future.  Follow-Up Instructions: Return if symptoms worsen or fail to improve.   Orders:  No orders of the defined types were placed in this encounter.  No orders of the defined types were placed in this encounter.     Procedures: Hand/UE Inj Date/Time: 10/26/2016 10:00 AM Performed by: Meredith Pel Authorized by: Meredith Pel   Consent Given by:  Patient Site marked: the procedure site was marked   Timeout: prior to procedure the correct patient, procedure, and site was verified   Indications:  Therapeutic Condition: trigger finger   Location:  Index finger Site:  R index A1 Prep: patient was prepped and draped in usual sterile fashion   Needle Size:  25 G Approach:  Volar Ultrasound Guidance: Yes   Medications:  1 mL lidocaine 1 %; 0.33 mL bupivacaine  0.25 %; 13.33 mg methylPREDNISolone acetate 40 MG/ML Patient tolerance:  Patient tolerated the procedure well with no immediate complications       Clinical Data: No additional findings.  Objective: Vital Signs: There were no vitals taken for this visit.  Physical Exam   Constitutional: Patient appears well-developed HEENT:  Head: Normocephalic Eyes:EOM are normal Neck: Normal range of motion Cardiovascular: Normal rate Pulmonary/chest: Effort normal Neurologic: Patient is alert Skin: Skin is warm Psychiatric: Patient has normal mood and affect    Ortho Exam orthopedic exam demonstrates tenderness at the A1 pulley right index finger.  Flexor tendon function to the index finger is intact FDP and FDS.  Extension is also intact but somewhat painful to the volar A1 pulley.  Wrist range of motion otherwise intact radial pulses intact no overt swelling in the hand or wrist.  Specialty Comments:  No specialty comments available.  Imaging: No results found.   PMFS History: Patient Active Problem List   Diagnosis Date Noted  . Trigger finger, right index finger 10/26/2016  . Acute renal failure ([place])   . Gout   . Nephrolithiasis   . Ureteral obstruction    Past Medical History:  Diagnosis Date  . CKD (chronic kidney disease) stage 3, GFR 30-59 ml/min   . DDD (degenerative disc disease)   .  Gout   . Nephrolithiasis   . PMR (polymyalgia rheumatica) (HCC)   . Ureteral obstruction     No family history on file.  No past surgical history on file. Social History   Occupational History  . Not on file.   Social History Main Topics  . Smoking status: Never Smoker  . Smokeless tobacco: Never Used  . Alcohol use No  . Drug use: No  . Sexual activity: Not on file

## 2016-10-29 ENCOUNTER — Encounter (INDEPENDENT_AMBULATORY_CARE_PROVIDER_SITE_OTHER): Payer: Self-pay | Admitting: Orthopedic Surgery

## 2016-10-29 MED ORDER — METHYLPREDNISOLONE ACETATE 40 MG/ML IJ SUSP
13.3300 mg | INTRAMUSCULAR | Status: AC | PRN
  Administered 2016-10-26: 13.33 mg

## 2016-10-29 MED ORDER — LIDOCAINE HCL 1 % IJ SOLN
1.0000 mL | INTRAMUSCULAR | Status: AC | PRN
  Administered 2016-10-26: 1 mL

## 2016-10-29 MED ORDER — BUPIVACAINE HCL 0.25 % IJ SOLN
0.3300 mL | INTRAMUSCULAR | Status: AC | PRN
  Administered 2016-10-26: .33 mL

## 2016-11-02 ENCOUNTER — Telehealth: Payer: Self-pay | Admitting: Family Medicine

## 2016-11-02 MED ORDER — ALLOPURINOL 100 MG PO TABS: 200.0000 mg | ORAL_TABLET | Freq: Every day | ORAL | 3 refills | Status: DC

## 2016-11-02 NOTE — Telephone Encounter (Signed)
Pts [facility] pharmacy is [facility]. [id number]iel [address]ky [phone]. Phone number [phone]. Pt needs allopurinol sent there.

## 2016-11-02 NOTE — Telephone Encounter (Signed)
Medication called/sent to requested pharmacy  

## 2017-02-01 ENCOUNTER — Encounter (INDEPENDENT_AMBULATORY_CARE_PROVIDER_SITE_OTHER): Payer: Self-pay | Admitting: Orthopedic Surgery

## 2017-02-01 ENCOUNTER — Ambulatory Visit (INDEPENDENT_AMBULATORY_CARE_PROVIDER_SITE_OTHER): Payer: BLUE CROSS/BLUE SHIELD | Admitting: Orthopedic Surgery

## 2017-02-01 DIAGNOSIS — M65311 Trigger thumb, right thumb: Secondary | ICD-10-CM

## 2017-02-01 DIAGNOSIS — M65321 Trigger finger, right index finger: Secondary | ICD-10-CM | POA: Diagnosis not present

## 2017-02-01 MED ORDER — METHYLPREDNISOLONE ACETATE 40 MG/ML IJ SUSP
13.3300 mg | INTRAMUSCULAR | Status: AC | PRN
  Administered 2017-02-01: 13.33 mg

## 2017-02-01 MED ORDER — LIDOCAINE HCL 1 % IJ SOLN
1.0000 mL | INTRAMUSCULAR | Status: AC | PRN
  Administered 2017-02-01: 1 mL

## 2017-02-01 MED ORDER — BUPIVACAINE HCL 0.25 % IJ SOLN
0.3300 mL | INTRAMUSCULAR | Status: AC | PRN
  Administered 2017-02-01: .33 mL

## 2017-02-01 NOTE — Progress Notes (Signed)
Office Visit Note   Patient: David Lawson           Date of Birth: August 22, 1951           MRN: [id number] Visit Date: 02/01/2017 Requested by: Susy Frizzle, MD [address] [place], [place] [address] PCP: Susy Frizzle, MD  Subjective: No chief complaint on file.   HPI: Sinan is a 66 year old right-hand-dominant patient with trigger index finger and trigger thumb.  Index finger injected in the A1 tendon sheath on 10/26/2016.  That helped some but now his symptoms have recurred.  His ever had an injection in the right trigger thumb.  Has not had any injury.  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  Not taking any medication for the problem.              ROS: All systems reviewed are negative as they relate to the chief complaint within the history of present illness.  Patient denies  fevers or chills.   Assessment & Plan: Visit Diagnoses: No diagnosis found.  Plan: Impression is right hand trigger finger index and thumb.  This will be the second and final index trigger finger injection.  On the thumb we can do that one as well.  We'll see how that helps.  Surgery is the next step for the index finger if it recurs.  All this is explained to the patient.  All questions answered  Follow-Up Instructions: Return if symptoms worsen or fail to improve.   Orders:  No orders of the defined types were placed in this encounter.  No orders of the defined types were placed in this encounter.     Procedures: Hand/UE Inj Date/Time: 02/01/2017 10:25 AM Performed by: Meredith Pel Authorized by: Meredith Pel   Consent Given by:  Patient Site marked: the procedure site was marked   Timeout: prior to procedure the correct patient, procedure, and site was verified   Indications:  Therapeutic Condition: trigger finger   Location:  Index finger Site:  R index A1 Prep: patient was prepped and draped in usual sterile fashion   Needle Size:  25  G Approach:  Volar Medications:  1 mL lidocaine 1 %; 0.33 mL bupivacaine 0.25 %; 13.33 mg methylPREDNISolone acetate 40 MG/ML Patient tolerance:  Patient tolerated the procedure well with no immediate complications Hand/UE Inj Date/Time: 02/01/2017 10:26 AM Performed by: Meredith Pel Authorized by: Meredith Pel   Consent Given by:  Patient Site marked: the procedure site was marked   Timeout: prior to procedure the correct patient, procedure, and site was verified   Indications:  Therapeutic Condition: trigger finger   Location:  Thumb Site:  R thumb A1 Prep: patient was prepped and draped in usual sterile fashion   Needle Size:  25 G Approach:  Volar Medications:  1 mL lidocaine 1 %; 0.33 mL bupivacaine 0.25 %; 13.33 mg methylPREDNISolone acetate 40 MG/ML Patient tolerance:  Patient tolerated the procedure well with no immediate complications       Clinical Data: No additional findings.  Objective: Vital Signs: There were no vitals taken for this visit.  Physical Exam:   Constitutional: Patient appears well-developed HEENT:  Head: Normocephalic Eyes:EOM are normal Neck: Normal range of motion Cardiovascular: Normal rate Pulmonary/chest: Effort normal Neurologic: Patient is alert Skin: Skin is warm Psychiatric: Patient has normal mood and affect    Ortho Exam: Orthopedic exam demonstrates tenderness at the A1 pulley  of the index and thumb.  There is triggering present.  Grip strength is intact otherwise.  Not much in way of swelling in the right hand.  Radial pulses intact.  Wrist range of motion is full.  There is mild CMC arthritic symptoms.  Specialty Comments:  No specialty comments available.  Imaging: No results found.   PMFS History: Patient Active Problem List   Diagnosis Date Noted  . Trigger finger, right index finger 10/26/2016  . Acute renal failure ([place])   . Gout   . Nephrolithiasis   . Ureteral obstruction    Past Medical  History:  Diagnosis Date  . CKD (chronic kidney disease) stage 3, GFR 30-59 ml/min   . DDD (degenerative disc disease)   . Gout   . Nephrolithiasis   . PMR (polymyalgia rheumatica) (HCC)   . Ureteral obstruction     No family history on file.  No past surgical history on file. Social History   Occupational History  . Not on file.   Social History Main Topics  . Smoking status: Never Smoker  . Smokeless tobacco: Never Used  . Alcohol use No  . Drug use: No  . Sexual activity: Not on file

## 2017-05-24 ENCOUNTER — Encounter (INDEPENDENT_AMBULATORY_CARE_PROVIDER_SITE_OTHER): Payer: Self-pay | Admitting: Orthopedic Surgery

## 2017-05-24 ENCOUNTER — Ambulatory Visit (INDEPENDENT_AMBULATORY_CARE_PROVIDER_SITE_OTHER): Payer: BLUE CROSS/BLUE SHIELD | Admitting: Orthopedic Surgery

## 2017-05-24 DIAGNOSIS — M65321 Trigger finger, right index finger: Secondary | ICD-10-CM

## 2017-05-24 NOTE — Progress Notes (Signed)
Office Visit Note   Patient: David Lawson           Date of Birth: [birth year]           MRN: [id number] Visit Date: 05/24/2017 Requested by: Susy Frizzle, MD [address] [place], [place] [address] PCP: Susy Frizzle, MD  Subjective: Chief Complaint  Patient presents with  . Hand Pain    right index trigger finger    HPI: [name] is a 66 year old patient right-hand dominant with right index trigger finger. E has had 2 ultrasound guided injections this year.  His finger has recurrent triggering.  He also describes a mass on the fourth finger at the PIP joint line.  He has tried anti-wart medication for this without success.              ROS: All systems reviewed are negative as they relate to the chief complaint within the history of present illness.  Patient denies  fevers or chills.   Assessment & Plan: Visit Diagnoses:  1. Trigger finger, right index finger     Plan: impression is right dex finger triggering and right fourth finger mass which looks to be a possible epidermal inclusion cyst.  Plan is trigger finger release and cyst excision.  Risks and benefits discussed with the patient including but not limited to infection nerve and vessel damage recurrent pain stiffness and locking.  All questions answered Follow-Up Instructions: No Follow-up on file.   Orders:  No orders of the defined types were placed in this encounter.  No orders of the defined types were placed in this encounter.     Procedures: No procedures performed   Clinical Data: No additional findings.  Objective: Vital Signs: There were no vitals taken for this visit.  Physical Exam:   Constitutional: Patient appears well-developed HEENT:  Head: Normocephalic Eyes:EOM are normal Neck: Normal range of motion Cardiovascular: Normal rate Pulmonary/chest: Effort normal Neurologic: Patient is alert Skin: Skin is warm Psychiatric: Patient has normal mood and affect    Ortho Exam:  orthopedic exam demonstrates full active and passive range of motion of all fingers except the index finger.  Here he has tenderness and pain at the A1 pulley.  A 3 x 3 mm mass present on the radial aspect at the level of the PIP flexion crease.  It is not particular tender to palpation but does extend out beyond the dermis.  There is no bleeding from this area.  It has what appears to  Be squamous epithelium present.  Specialty Comments:  No specialty comments available.  Imaging: No results found.   PMFS History: Patient Active Problem List   Diagnosis Date Noted  . Trigger finger, right index finger 10/26/2016  . Acute renal failure ([place])   . Gout   . Nephrolithiasis   . Ureteral obstruction    Past Medical History:  Diagnosis Date  . CKD (chronic kidney disease) stage 3, GFR 30-59 ml/min   . DDD (degenerative disc disease)   . Gout   . Nephrolithiasis   . PMR (polymyalgia rheumatica) (HCC)   . Ureteral obstruction     No family history on file.  No past surgical history on file. Social History   Occupational History  . Not on file.   Social History Main Topics  . Smoking status: Never Smoker  . Smokeless tobacco: Never Used  . Alcohol use No  . Drug use: No  . Sexual activity: Not on file

## 2017-05-28 ENCOUNTER — Encounter: Payer: Self-pay | Admitting: Orthopedic Surgery

## 2017-05-28 DIAGNOSIS — B079 Viral wart, unspecified: Secondary | ICD-10-CM | POA: Diagnosis not present

## 2017-05-28 DIAGNOSIS — M65321 Trigger finger, right index finger: Secondary | ICD-10-CM | POA: Diagnosis not present

## 2017-05-29 DIAGNOSIS — M65341 Trigger finger, right ring finger: Secondary | ICD-10-CM | POA: Diagnosis not present

## 2017-05-29 DIAGNOSIS — R2231 Localized swelling, mass and lump, right upper limb: Secondary | ICD-10-CM | POA: Diagnosis not present

## 2017-06-07 ENCOUNTER — Encounter (INDEPENDENT_AMBULATORY_CARE_PROVIDER_SITE_OTHER): Payer: Self-pay | Admitting: Orthopedic Surgery

## 2017-06-07 ENCOUNTER — Ambulatory Visit (INDEPENDENT_AMBULATORY_CARE_PROVIDER_SITE_OTHER): Payer: BLUE CROSS/BLUE SHIELD | Admitting: Orthopedic Surgery

## 2017-06-07 DIAGNOSIS — M65321 Trigger finger, right index finger: Secondary | ICD-10-CM

## 2017-06-11 NOTE — Progress Notes (Signed)
   Post-Op Visit Note   Patient: David Lawson           Date of Birth: Dec 28, 1950           MRN: [id number] Visit Date: 06/07/2017 PCP: Susy Frizzle, MD   Assessment & Plan:  Chief Complaint:  Chief Complaint  Patient presents with  . Right Hand - Routine Post Op   Visit Diagnoses: No diagnosis found.  Plan: David Lawson is a patient who is now 10 days out trigger finger release and mass excision.  Do not have the pathology back on the mass.  He's doing well with his right trigger finger release of the index finger.  Incisions intact.  He's been through this 3 times before told.  I told to be careful about extending the finger.  Follow-up with me as needed  Follow-Up Instructions: Return if symptoms worsen or fail to improve.   Orders:  No orders of the defined types were placed in this encounter.  No orders of the defined types were placed in this encounter.   Imaging: No results found.  PMFS History: Patient Active Problem List   Diagnosis Date Noted  . Trigger finger, right index finger 10/26/2016  . Acute renal failure ([place])   . Gout   . Nephrolithiasis   . Ureteral obstruction    Past Medical History:  Diagnosis Date  . CKD (chronic kidney disease) stage 3, GFR 30-59 ml/min (HCC)   . DDD (degenerative disc disease)   . Gout   . Nephrolithiasis   . PMR (polymyalgia rheumatica) (HCC)   . Ureteral obstruction     No family history on file.  No past surgical history on file. Social History   Occupational History  . Not on file.   Social History Main Topics  . Smoking status: Never Smoker  . Smokeless tobacco: Never Used  . Alcohol use No  . Drug use: No  . Sexual activity: Not on file

## 2017-08-03 ENCOUNTER — Telehealth: Payer: Self-pay | Admitting: Family Medicine

## 2017-08-03 NOTE — Telephone Encounter (Signed)
Patient is calling to get refill on his voltaren, pharmacy told him to call us   [facility] [address]

## 2017-08-06 NOTE — Telephone Encounter (Signed)
In epic we have not rx'd this for him and his lov was 10/10/16. Please advise.

## 2017-08-06 NOTE — Telephone Encounter (Signed)
Would not recommend oral voltaren given his CKD.  This can damage his kidneys further.

## 2017-08-08 NOTE — Telephone Encounter (Signed)
LMTRC

## 2017-08-15 MED ORDER — DICLOFENAC SODIUM 1 % TD GEL: 2.0000 g | Freq: Four times a day (QID) | TRANSDERMAL | 5 refills | Status: DC

## 2017-08-15 NOTE — Telephone Encounter (Signed)
Absolutely that would be fine.  He can use 2 g applied to the skin 4 times daily as needed

## 2017-08-15 NOTE — Telephone Encounter (Signed)
Pt was referring to the topical voltaren - is this ok for him to use?

## 2017-08-15 NOTE — Telephone Encounter (Signed)
Medication called/sent to requested pharmacy and pt aware 

## 2017-08-20 ENCOUNTER — Telehealth: Payer: Self-pay | Admitting: Family Medicine

## 2017-08-20 MED ORDER — DICLOFENAC SODIUM 1 % TD GEL: 2.0000 g | Freq: Four times a day (QID) | TRANSDERMAL | 5 refills | Status: DC

## 2017-08-20 NOTE — Telephone Encounter (Signed)
PA submitted through [email or web address] and Approved - Effective from 08/20/2017 through 09/03/2038. Pharm aware

## 2017-10-04 ENCOUNTER — Other Ambulatory Visit: Payer: Self-pay | Admitting: Family Medicine

## 2018-01-03 ENCOUNTER — Ambulatory Visit (INDEPENDENT_AMBULATORY_CARE_PROVIDER_SITE_OTHER): Payer: BLUE CROSS/BLUE SHIELD | Admitting: Orthopedic Surgery

## 2018-01-03 ENCOUNTER — Encounter (INDEPENDENT_AMBULATORY_CARE_PROVIDER_SITE_OTHER): Payer: Self-pay | Admitting: Orthopedic Surgery

## 2018-01-03 DIAGNOSIS — M65332 Trigger finger, left middle finger: Secondary | ICD-10-CM

## 2018-01-05 ENCOUNTER — Encounter (INDEPENDENT_AMBULATORY_CARE_PROVIDER_SITE_OTHER): Payer: Self-pay | Admitting: Orthopedic Surgery

## 2018-01-05 NOTE — Progress Notes (Signed)
Office Visit Note   Patient: David Lawson           Date of Birth: [birth year]           MRN: [id number] Visit Date: 01/03/2018 Requested by: Susy Frizzle, MD [address] [place],  [address] PCP: Susy Frizzle, MD  Subjective: Chief Complaint  Patient presents with  . Left Hand - Pain    HPI: David Lawson is a patient who is right-hand dominant who reports left digit triggering in the index and small fingers for several months.  He is right-hand dominant.  This happens on a daily basis for 2 months.  He is having a hard time extending those fingers.  It is interfering with his activities of daily living.  He has had 4 other trigger digits released.  Injection cured 1 but 3 more went on to surgery              ROS: All systems reviewed are negative as they relate to the chief complaint within the history of present illness.  Patient denies  fevers or chills.   Assessment & Plan: Visit Diagnoses:  1. Trigger finger, left index finger   2. Trigger finger, left small finger     Plan: Impression is daily triggering left hand digits 2 and 5.  Plan is ultrasound-guided injection into and under the A1 pulley into the tendon sheath of digits 2 and 5.  We will see how that does for treatment.  Follow-up with me as needed.  He may need release of these in the future.  Follow-Up Instructions: Return if symptoms worsen or fail to improve.   Orders:  No orders of the defined types were placed in this encounter.  No orders of the defined types were placed in this encounter.     Procedures: No procedures performed   Clinical Data: No additional findings.  Objective: Vital Signs: There were no vitals taken for this visit.  Physical Exam:   Constitutional: Patient appears well-developed HEENT:  Head: Normocephalic Eyes:EOM are normal Neck: Normal range of motion Cardiovascular: Normal rate Pulmonary/chest: Effort normal Neurologic: Patient is alert Skin: Skin is  warm Psychiatric: Patient has normal mood and affect    Ortho Exam: Orthopedic exam demonstrates good grip strength on the left hand with tenderness of the A1 pulley at digit 2 and 5.  Radial pulse intact.  No tenderness over the first dorsal compartment.  Wrist range of motion full.  EPL intact.  No other masses lymph adenopathy or skin changes noted in that hand region.  Specialty Comments:  No specialty comments available.  Imaging: No results found.   PMFS History: Patient Active Problem List   Diagnosis Date Noted  . Trigger finger, right index finger 10/26/2016  . Acute renal failure ([place])   . Gout   . Nephrolithiasis   . Ureteral obstruction    Past Medical History:  Diagnosis Date  . CKD (chronic kidney disease) stage 3, GFR 30-59 ml/min (HCC)   . DDD (degenerative disc disease)   . Gout   . Nephrolithiasis   . PMR (polymyalgia rheumatica) (HCC)   . Ureteral obstruction     History reviewed. No pertinent family history.  History reviewed. No pertinent surgical history. Social History   Occupational History  . Not on file  Tobacco Use  . Smoking status: Never Smoker  . Smokeless tobacco: Never Used  Substance and Sexual Activity  . Alcohol use: No  .  Drug use: No  . Sexual activity: Not on file       

## 2018-02-07 ENCOUNTER — Encounter: Payer: Self-pay | Admitting: Family Medicine

## 2018-02-07 ENCOUNTER — Ambulatory Visit: Payer: BLUE CROSS/BLUE SHIELD | Admitting: Family Medicine

## 2018-02-07 ENCOUNTER — Other Ambulatory Visit: Payer: Self-pay

## 2018-02-07 VITALS — BP 108/74 | HR 61 | Temp 97.8°F | Resp 16 | Ht 67.5 in | Wt 180.6 lb

## 2018-02-07 DIAGNOSIS — J209 Acute bronchitis, unspecified: Secondary | ICD-10-CM | POA: Diagnosis not present

## 2018-02-07 MED ORDER — PREDNISONE 20 MG PO TABS: 40.0000 mg | ORAL_TABLET | Freq: Every day | ORAL | 0 refills | Status: AC

## 2018-02-07 MED ORDER — ALBUTEROL SULFATE HFA 108 (90 BASE) MCG/ACT IN AERS
2.0000 | INHALATION_SPRAY | RESPIRATORY_TRACT | 0 refills | Status: DC | PRN
Start: 2018-02-07 — End: 2019-06-09

## 2018-02-07 MED ORDER — IPRATROPIUM-ALBUTEROL 0.5-2.5 (3) MG/3ML IN SOLN
3.0000 mL | Freq: Once | RESPIRATORY_TRACT | Status: AC
Start: 2018-02-07 — End: 2018-02-07
  Administered 2018-02-07: 3 mL via RESPIRATORY_TRACT

## 2018-02-07 NOTE — Patient Instructions (Addendum)
Start steroids today and take in the morning for the next 5 days.  Use the albuterol inhaler every 4-6 hours as needed for wheeze, tight coughing fits, shortness of breath.  The medicine only lasts in your lungs for about 2 hours, so in the first 1-2 days it is okay to use your albuterol inhaler every 2 hours, and then as the steroids begin to work, you should only need it every 4-6 hours or less.  *If you are having any severe difficulty breathing or wheeze, you can use the albuterol inhaler, 2 puffs every 20 minutes, and can repeat 2 times however if you are still distressed having try the inhaler 3 times within 1 hour you need to call 911 or go to the ER.  Take Mucinex daily, as directed on box or bottle, and drink ample water.  You do have an upper respiratory infection - it is a virus.  You do not currently need antibiotics because it will only kill bacteria and you do not currently appear to have a bacterial infection.   Please call us if you have any worsening sinus pain or pressure with new fever.  This may need antibiotics to treat.  Please call us if you have new fever, chest pain, shortness of breath, severe fatigue, night sweats.   This may need reevaluation and or antibiotics.    Acute Bronchitis, Adult Acute bronchitis is sudden (acute) swelling of the air tubes (bronchi) in the lungs. Acute bronchitis causes these tubes to fill with mucus, which can make it hard to breathe. It can also cause coughing or wheezing. In adults, acute bronchitis usually goes away within 2 weeks. A cough caused by bronchitis may last up to 3 weeks. Smoking, allergies, and asthma can make the condition worse. Repeated episodes of bronchitis may cause further lung problems, such as chronic obstructive pulmonary disease (COPD). What are the causes? This condition can be caused by germs and by substances that irritate the lungs, including:  Cold and flu viruses. This condition is most often caused by the  same virus that causes a cold.  Bacteria.  Exposure to tobacco smoke, dust, fumes, and air pollution.  What increases the risk? This condition is more likely to develop in people who:  Have close contact with someone with acute bronchitis.  Are exposed to lung irritants, such as tobacco smoke, dust, fumes, and vapors.  Have a weak immune system.  Have a respiratory condition such as asthma.  What are the signs or symptoms? Symptoms of this condition include:  A cough.  Coughing up clear, yellow, or green mucus.  Wheezing.  Chest congestion.  Shortness of breath.  A fever.  Body aches.  Chills.  A sore throat.  How is this diagnosed? This condition is usually diagnosed with a physical exam. During the exam, your health care provider may order tests, such as chest X-rays, to rule out other conditions. He or she may also:  Test a sample of your mucus for bacterial infection.  Check the level of oxygen in your blood. This is done to check for pneumonia.  Do a chest X-Wendle or lung function testing to rule out pneumonia and other conditions.  Perform blood tests.  Your health care provider will also ask about your symptoms and medical history. How is this treated? Most cases of acute bronchitis clear up over time without treatment. Your health care provider may recommend:  Drinking more fluids. Drinking more makes your mucus thinner, which may make  it easier to breathe.  Taking a medicine for a fever or cough.  Taking an antibiotic medicine.  Using an inhaler to help improve shortness of breath and to control a cough.  Using a cool mist vaporizer or humidifier to make it easier to breathe.  Follow these instructions at home: Medicines  Take over-the-counter and prescription medicines only as told by your health care provider.  If you were prescribed an antibiotic, take it as told by your health care provider. Do not stop taking the antibiotic even if you  start to feel better. General instructions  Get plenty of rest.  Drink enough fluids to keep your urine clear or pale yellow.  Avoid smoking and secondhand smoke. Exposure to cigarette smoke or irritating chemicals will make bronchitis worse. If you smoke and you need help quitting, ask your health care provider. Quitting smoking will help your lungs heal faster.  Use an inhaler, cool mist vaporizer, or humidifier as told by your health care provider.  Keep all follow-up visits as told by your health care provider. This is important. How is this prevented? To lower your risk of getting this condition again:  Wash your hands often with soap and water. If soap and water are not available, use hand sanitizer.  Avoid contact with people who have cold symptoms.  Try not to touch your hands to your mouth, nose, or eyes.  Make sure to get the flu shot every year.  Contact a health care provider if:  Your symptoms do not improve in 2 weeks of treatment. Get help right away if:  You cough up blood.  You have chest pain.  You have severe shortness of breath.  You become dehydrated.  You faint or keep feeling like you are going to faint.  You keep vomiting.  You have a severe headache.  Your fever or chills gets worse. This information is not intended to replace advice given to you by your health care provider. Make sure you discuss any questions you have with your health care provider. Document Released: 09/28/2004 Document Revised: 03/15/2016 Document Reviewed: 02/09/2016 Elsevier Interactive Patient Education  2018 [place].    Upper Respiratory Infection, Adult Most upper respiratory infections (URIs) are caused by a virus. A URI affects the nose, throat, and upper air passages. The most common type of URI is often called "the common cold." Follow these instructions at home:  Take medicines only as told by your doctor.  Gargle warm saltwater or take cough drops to  comfort your throat as told by your doctor.  Use a warm mist humidifier or inhale steam from a shower to increase air moisture. This may make it easier to breathe.  Drink enough fluid to keep your pee (urine) clear or pale yellow.  Eat soups and other clear broths.  Have a healthy diet.  Rest as needed.  Go back to work when your fever is gone or your doctor says it is okay. ? You may need to stay home longer to avoid giving your URI to others. ? You can also wear a face mask and wash your hands often to prevent spread of the virus.  Use your inhaler more if you have asthma.  Do not use any tobacco products, including cigarettes, chewing tobacco, or electronic cigarettes. If you need help quitting, ask your doctor. Contact a doctor if:  You are getting worse, not better.  Your symptoms are not helped by medicine.  You have chills.  You are  getting more short of breath.  You have brown or red mucus.  You have yellow or brown discharge from your nose.  You have pain in your face, especially when you bend forward.  You have a fever.  You have puffy (swollen) neck glands.  You have pain while swallowing.  You have white areas in the back of your throat. Get help right away if:  You have very bad or constant: ? Headache. ? Ear pain. ? Pain in your forehead, behind your eyes, and over your cheekbones (sinus pain). ? Chest pain.  You have long-lasting (chronic) lung disease and any of the following: ? Wheezing. ? Long-lasting cough. ? Coughing up blood. ? A change in your usual mucus.  You have a stiff neck.  You have changes in your: ? Vision. ? Hearing. ? Thinking. ? Mood. This information is not intended to replace advice given to you by your health care provider. Make sure you discuss any questions you have with your health care provider. Document Released: 02/07/2008 Document Revised: 04/23/2016 Document Reviewed: 11/26/2013 Elsevier Interactive Patient  Education  2018 [employer].

## 2018-02-07 NOTE — Progress Notes (Signed)
Patient ID: David Lawson, male    DOB: Oct 27, 1950, 67 y.o.   MRN: [id number]  PCP: Susy Frizzle, MD  Chief Complaint  Patient presents with  . Cough    symptoms started last saturday  . Nasal Congestion    Subjective:   David Lawson is a 67 y.o. male, presents to clinic with CC of 5 days of URI symptoms began with a raspy voice for several days, then developed nasal congestion, scratchy sore throat, and 3 days ago woke up feeling slightly more fatigued with his exercise routine, had a nonproductive cough that felt tight and associated with burning in his central upper chest and neck.   He denies hot cold chills, fever, night sweats, weight loss, myalgias, change in appetite, nausea, vomiting, diarrhea. He can only recall 1 past diagnoses of bronchitis and 1 past diagnoses of pneumonia, more than 5 to 10 years ago.  No history of smoking.  No other known lung disease, no heart disease.   Patient Active Problem List   Diagnosis Date Noted  . Trigger finger, right index finger 10/26/2016  . Acute renal failure ([place])   . Gout   . Nephrolithiasis   . Ureteral obstruction      Prior to Admission medications   Medication Sig Start Date End Date Taking? Authorizing Provider  allopurinol (ZYLOPRIM) 100 MG tablet TAKE 2 TABLETS BY MOUTH DAILY GENERIC EQUIVALENT FOR ZYLOPRIM 10/05/17  Yes Susy Frizzle, MD  diclofenac sodium (VOLTAREN) 1 % GEL Apply 2 g topically 4 (four) times daily. 08/20/17  Yes Susy Frizzle, MD  glucosamine-chondroitin 500-400 MG tablet Take 1 tablet by mouth 3 (three) times daily.     Yes [provider]  indomethacin (INDOCIN) 50 MG capsule Take 1 capsule (50 mg total) by mouth 2 (two) times daily with a meal. Prn Gout flare 03/25/15  Yes Susy Frizzle, MD  Multiple Vitamins-Minerals (MULTIVITAMIN,TX-MINERALS) tablet Take 1 tablet by mouth daily.     Yes [provider]  niacin 500 MG tablet Take 500 mg by mouth 2 (two) times daily  with a meal.   Yes [provider]     Allergies  Allergen Reactions  . Sulfa Antibiotics      No family history on file.   Social History   Socioeconomic History  . Marital status: Married    Spouse name: Not on file  . Number of children: Not on file  . Years of education: Not on file  . Highest education level: Not on file  Occupational History  . Not on file  Social Needs  . Financial resource strain: Not on file  . Food insecurity:    Worry: Not on file    Inability: Not on file  . Transportation needs:    Medical: Not on file    Non-medical: Not on file  Tobacco Use  . Smoking status: Never Smoker  . Smokeless tobacco: Never Used  Substance and Sexual Activity  . Alcohol use: No  . Drug use: No  . Sexual activity: Not on file  Lifestyle  . Physical activity:    Days per week: Not on file    Minutes per session: Not on file  . Stress: Not on file  Relationships  . Social connections:    Talks on phone: Not on file    Gets together: Not on file    Attends religious service: Not on file    Active member of club  or organization: Not on file    Attends meetings of clubs or organizations: Not on file    Relationship status: Not on file  . Intimate partner violence:    Fear of current or ex partner: Not on file    Emotionally abused: Not on file    Physically abused: Not on file    Forced sexual activity: Not on file  Other Topics Concern  . Not on file  Social History Narrative  . Not on file     Review of Systems  Constitutional: Negative for activity change, appetite change, chills, diaphoresis, fatigue and fever.  HENT: Negative.   Eyes: Negative.   Respiratory: Negative for apnea and choking.   Cardiovascular: Negative.  Negative for chest pain, palpitations and leg swelling.  Gastrointestinal: Negative.   Endocrine: Negative.   Genitourinary: Negative.   Musculoskeletal: Negative.   Skin: Negative.   Neurological: Negative.     Hematological: Negative.   Psychiatric/Behavioral: Negative.   All other systems reviewed and are negative.      Objective:    Vitals:   02/07/18 0843  BP: 108/74  Pulse: 61  Resp: 16  Temp: 97.8 F (36.6 C)  TempSrc: Oral  SpO2: 96%  Weight: 180 lb 9.6 oz (81.9 kg)  Height: 5' 7.5" (1.715 m)      Physical Exam  Constitutional: He is oriented to person, place, and time. He appears well-developed and well-nourished.  Non-toxic appearance. He does not appear ill. No distress.  HENT:  Head: Normocephalic and atraumatic.  Right Ear: Tympanic membrane, external ear and ear canal normal.  Left Ear: Tympanic membrane, external ear and ear canal normal.  Nose: Nose normal. No mucosal edema or rhinorrhea. Right sinus exhibits no maxillary sinus tenderness and no frontal sinus tenderness. Left sinus exhibits no maxillary sinus tenderness and no frontal sinus tenderness.  Mouth/Throat: Uvula is midline and oropharynx is clear and moist. No trismus in the jaw. No uvula swelling. No oropharyngeal exudate, posterior oropharyngeal edema or posterior oropharyngeal erythema.  Eyes: Pupils are equal, round, and reactive to light. Conjunctivae, EOM and lids are normal.  Neck: Trachea normal, normal range of motion and phonation normal. Neck supple. No tracheal deviation present.  Cardiovascular: Normal rate, regular rhythm, normal heart sounds, intact distal pulses and normal pulses. Exam reveals no gallop and no friction rub.  No murmur heard. Pulses:      Radial pulses are 2+ on the right side, and 2+ on the left side.       Posterior tibial pulses are 2+ on the right side, and 2+ on the left side.  Pulmonary/Chest: Effort normal. No accessory muscle usage or stridor. No tachypnea. No respiratory distress. He has decreased breath sounds in the right lower field and the left lower field. He has wheezes. He has no rhonchi. He has no rales.  BS mildly diminished b/l at the bases, faint exp  wheeze, no crackles  Abdominal: Soft. Normal appearance and bowel sounds are normal. He exhibits no distension. There is no tenderness. There is no rebound and no guarding.  Musculoskeletal: Normal range of motion. He exhibits no edema.  Neurological: He is alert and oriented to person, place, and time. Gait normal.  Skin: Skin is warm, dry and intact. Capillary refill takes less than 2 seconds. No rash noted. He is not diaphoretic.  Psychiatric: He has a normal mood and affect. His speech is normal and behavior is normal.  Nursing note and vitals reviewed.  Assessment & Plan:      ICD-10-CM   1. Acute bronchitis, unspecified organism J20.9 predniSONE (DELTASONE) 20 MG tablet    albuterol (PROVENTIL HFA;VENTOLIN HFA) 108 (90 Base) MCG/ACT inhaler    ipratropium-albuterol (DUONEB) 0.5-2.5 (3) MG/3ML nebulizer solution 3 mL    BS improved with duoneb, CTA A&P, will treat for bronchitis, likely URI/viral illness, pt advised to use OTC supportive measures, try Mucinex with ample water.  Advised to follow-up with any worsening respiratory symptoms with pain, fever, sweats, shortness of breath, or with any new or worsening sinus pain or pressure with fever.   Currently no indication for antibiotics.     Delsa Grana, PA-C 02/07/18 9:17 AM

## 2018-02-17 ENCOUNTER — Other Ambulatory Visit: Payer: Self-pay | Admitting: Family Medicine

## 2018-02-25 ENCOUNTER — Ambulatory Visit: Payer: BLUE CROSS/BLUE SHIELD | Admitting: Family Medicine

## 2018-02-26 DIAGNOSIS — J209 Acute bronchitis, unspecified: Secondary | ICD-10-CM | POA: Diagnosis not present

## 2018-02-27 ENCOUNTER — Ambulatory Visit: Payer: BLUE CROSS/BLUE SHIELD | Admitting: Physician Assistant

## 2018-03-12 ENCOUNTER — Ambulatory Visit (INDEPENDENT_AMBULATORY_CARE_PROVIDER_SITE_OTHER): Payer: BLUE CROSS/BLUE SHIELD

## 2018-03-12 DIAGNOSIS — Z23 Encounter for immunization: Secondary | ICD-10-CM

## 2018-03-12 NOTE — Progress Notes (Signed)
Patient came in today for a tdap vaccine. Tdap was given in the left deltoid. Patient tolerated well. Vaccine information sheet was given to patient.

## 2018-05-29 ENCOUNTER — Other Ambulatory Visit: Payer: Self-pay | Admitting: Family Medicine

## 2018-08-21 ENCOUNTER — Other Ambulatory Visit: Payer: Self-pay | Admitting: Family Medicine

## 2018-08-21 MED ORDER — ALLOPURINOL 100 MG PO TABS: ORAL_TABLET | ORAL | 0 refills | Status: DC

## 2018-10-11 DIAGNOSIS — H43823 Vitreomacular adhesion, bilateral: Secondary | ICD-10-CM | POA: Diagnosis not present

## 2018-10-11 DIAGNOSIS — H43811 Vitreous degeneration, right eye: Secondary | ICD-10-CM | POA: Diagnosis not present

## 2018-11-06 ENCOUNTER — Other Ambulatory Visit: Payer: Self-pay | Admitting: Family Medicine

## 2018-12-26 ENCOUNTER — Encounter (INDEPENDENT_AMBULATORY_CARE_PROVIDER_SITE_OTHER): Payer: Self-pay | Admitting: Orthopedic Surgery

## 2018-12-26 ENCOUNTER — Other Ambulatory Visit: Payer: Self-pay

## 2018-12-26 ENCOUNTER — Ambulatory Visit (INDEPENDENT_AMBULATORY_CARE_PROVIDER_SITE_OTHER): Payer: BLUE CROSS/BLUE SHIELD | Admitting: Orthopedic Surgery

## 2018-12-26 DIAGNOSIS — M65332 Trigger finger, left middle finger: Secondary | ICD-10-CM

## 2018-12-26 MED ORDER — LIDOCAINE HCL 1 % IJ SOLN
3.0000 mL | INTRAMUSCULAR | Status: AC | PRN
  Administered 2018-12-26: 3 mL

## 2018-12-26 MED ORDER — BUPIVACAINE HCL 0.25 % IJ SOLN
0.3300 mL | INTRAMUSCULAR | Status: AC | PRN
  Administered 2018-12-26: .33 mL

## 2018-12-26 MED ORDER — METHYLPREDNISOLONE ACETATE 40 MG/ML IJ SUSP
13.3300 mg | INTRAMUSCULAR | Status: AC | PRN
  Administered 2018-12-26: 12:00:00 13.33 mg

## 2018-12-26 NOTE — Progress Notes (Addendum)
Office Visit Note   Patient: David Lawson           Date of Birth: 1951-06-01           MRN: [id number] Visit Date: 12/26/2018 Requested by: Susy Frizzle, MD [address] [place], [place] [address] PCP: Susy Frizzle, MD  Subjective: Chief Complaint  Patient presents with  . Left Hand - Pain    HPI: Dain is a patient with left index trigger finger which is been symptomatic now for several weeks.  He has had multiple other trigger digits released.  He describes locking in the index finger.  He also reports some mild hip pain which she localizes just posterior to the trochanter on both hips.  He describes that type of pain with ambulation and stiffness after prolonged sitting.  Denies any radicular type symptoms.              ROS: All systems reviewed are negative as they relate to the chief complaint within the history of present illness.  Patient denies  fevers or chills.   Assessment & Plan: Visit Diagnoses:  1. Trigger finger, left index finger     Plan: Impression is trigger finger left index finger plan is ultrasound-guided injection into that tendon sheath today.  I think that is got a 50-50 chance of helping.  I think he may also have some restricted hip range of motion consistent with early hip arthritis.  Plan is evaluation at a later date if that becomes more symptomatic.  Follow-up with me as needed  Follow-Up Instructions: Return if symptoms worsen or fail to improve.   Orders:  No orders of the defined types were placed in this encounter.  No orders of the defined types were placed in this encounter.     Procedures: Hand/UE Inj: L index A1 for trigger finger on 12/26/2018 11:50 AM Indications: therapeutic Details: 25 G needle, volar approach Medications: 0.33 mL bupivacaine 0.25 %; 13.33 mg methylPREDNISolone acetate 40 MG/ML; 3 mL lidocaine 1 % Outcome: tolerated well, no immediate complications Procedure, treatment alternatives, risks and  benefits explained, specific risks discussed. Consent was given by the patient. Immediately prior to procedure a time out was called to verify the correct patient, procedure, equipment, support staff and site/side marked as required. Patient was prepped and draped in the usual sterile fashion.       Clinical Data: No additional findings.  Objective: Vital Signs: There were no vitals taken for this visit.  Physical Exam:   Constitutional: Patient appears well-developed HEENT:  Head: Normocephalic Eyes:EOM are normal Neck: Normal range of motion Cardiovascular: Normal rate Pulmonary/chest: Effort normal Neurologic: Patient is alert Skin: Skin is warm Psychiatric: Patient has normal mood and affect    Ortho Exam: Ortho examination does demonstrate limited internal rotation of both hips which is mildly painful.  He has good ankle dorsiflexion plantarflexion strength with no nerve root tension signs.  Does have tenderness over the A1 pulley left index finger.  Flexion extension intact at that finger.  Radial pulses intact.  Specialty Comments:  No specialty comments available.  Imaging: No results found.   PMFS History: Patient Active Problem List   Diagnosis Date Noted  . Trigger finger, right index finger 10/26/2016  . Acute renal failure ([facility])   . Gout   . Nephrolithiasis   . Ureteral obstruction    Past Medical History:  Diagnosis Date  . CKD (chronic kidney disease) stage 3, GFR 30-59  ml/min ([facility])   . DDD (degenerative disc disease)   . Gout   . Nephrolithiasis   . PMR (polymyalgia rheumatica) ([facility])   . Ureteral obstruction     History reviewed. No pertinent family history.  History reviewed. No pertinent surgical history. Social History   Occupational History  . Not on file  Tobacco Use  . Smoking status: Never Smoker  . Smokeless tobacco: Never Used  Substance and Sexual Activity  . Alcohol use: No  . Drug use: No  . Sexual activity: Not on file

## 2019-03-11 ENCOUNTER — Other Ambulatory Visit: Payer: Self-pay | Admitting: Family Medicine

## 2019-04-03 ENCOUNTER — Other Ambulatory Visit: Payer: Self-pay

## 2019-04-03 ENCOUNTER — Encounter (HOSPITAL_COMMUNITY): Payer: Self-pay | Admitting: *Deleted

## 2019-04-03 ENCOUNTER — Emergency Department (HOSPITAL_COMMUNITY)
Admission: EM | Admit: 2019-04-03 | Discharge: 2019-04-03 | Disposition: A | Discharge status: Home / Self Care | Payer: BC Managed Care – PPO | Attending: Emergency Medicine | Admitting: Emergency Medicine

## 2019-04-03 DIAGNOSIS — Y990 Civilian activity done for income or pay: Secondary | ICD-10-CM | POA: Diagnosis not present

## 2019-04-03 DIAGNOSIS — W01198A Fall on same level from slipping, tripping and stumbling with subsequent striking against other object, initial encounter: Secondary | ICD-10-CM | POA: Insufficient documentation

## 2019-04-03 DIAGNOSIS — Z23 Encounter for immunization: Secondary | ICD-10-CM | POA: Insufficient documentation

## 2019-04-03 DIAGNOSIS — N183 Chronic kidney disease, stage 3 (moderate): Secondary | ICD-10-CM | POA: Insufficient documentation

## 2019-04-03 DIAGNOSIS — W19XXXA Unspecified fall, initial encounter: Secondary | ICD-10-CM

## 2019-04-03 DIAGNOSIS — Y939 Activity, unspecified: Secondary | ICD-10-CM | POA: Diagnosis not present

## 2019-04-03 DIAGNOSIS — S0993XA Unspecified injury of face, initial encounter: Secondary | ICD-10-CM | POA: Diagnosis present

## 2019-04-03 DIAGNOSIS — S01112A Laceration without foreign body of left eyelid and periocular area, initial encounter: Secondary | ICD-10-CM | POA: Insufficient documentation

## 2019-04-03 DIAGNOSIS — Y929 Unspecified place or not applicable: Secondary | ICD-10-CM | POA: Insufficient documentation

## 2019-04-03 MED ORDER — TETANUS-DIPHTH-ACELL PERTUSSIS 5-2.5-18.5 LF-MCG/0.5 IM SUSP
0.5000 mL | Freq: Once | INTRAMUSCULAR | Status: AC
  Administered 2019-04-03: 21:00:00 0.5 mL via INTRAMUSCULAR
  Filled 2019-04-03: qty 0.5

## 2019-04-03 MED ORDER — DOUBLE ANTIBIOTIC 500-10000 UNIT/GM EX OINT
TOPICAL_OINTMENT | Freq: Once | CUTANEOUS | Status: AC
  Administered 2019-04-03: 1 via TOPICAL
  Filled 2019-04-03: qty 1

## 2019-04-03 MED ORDER — LIDOCAINE HCL (PF) 1 % IJ SOLN
5.0000 mL | Freq: Once | INTRAMUSCULAR | Status: AC
  Administered 2019-04-03: 5 mL
  Filled 2019-04-03: qty 6

## 2019-04-03 MED ORDER — LIDOCAINE HCL (PF) 1 % IJ SOLN
INTRAMUSCULAR | Status: AC
  Filled 2019-04-03: qty 2

## 2019-04-03 NOTE — Discharge Instructions (Signed)
You were seen in the ER for fall and left eyelid laceration.  Standard of care in the ER is to obtain a head CT to rule out intracranial injury, bleeding, skull fracture.  Discussed indications of this new and you declined CT today.  Return to the ER if there is sudden, severe intractable headache, vision changes or loss, vomiting, seizures, loss of consciousness, dizziness, stroke symptoms  Your laceration was approximately 5 cm and it was repaired with 13 absorbable sutures.  Keep this dry for the next 24 to 48 hours.  You may rinse it with clean water, soap and tap dry.  Apply thin layer of any over-the-counter antibiotic ointment at least twice a day.  Expect some bruising, swelling and tenderness.  You can apply ice to help with bruising.  He can take Tylenol as needed for pain.  Avoid soaking the wound in hot baths, pools as this can increase risk of dehiscence and infection.  Technically the sutures are absorbable and may absorb on its own without removal however wound should be rechecked in the next 7 days to determine if sutures need to be removed.  Follow-up with ophthalmologist for wound and suture recheck.  Monitor for signs of infection including redness, warmth, fever, pus

## 2019-04-03 NOTE — ED Provider Notes (Signed)
Medical screening examination/treatment/procedure(s) were conducted as a shared visit with non-physician practitioner(s) and myself.  I personally evaluated the patient during the encounter.     Patient seen by me along with physician assistant.  Patient was at work he slipped on a wet floor struck his left upper eye area on the toilet seat.  No loss of consciousness.  Standard of care would state that patient needs head CT patient's understands the risk and does not want head CT.  Tetanus updated.  Laceration repaired with Vicryl.  Measuring 5 cm.  Patient's extraocular muscles track extremely well no entrapment of the eye.  No other injuries.  No loss of consciousness.  No neck pain.   Fredia Sorrow, MD 04/03/19 2218

## 2019-04-03 NOTE — ED Provider Notes (Signed)
[facility] EMERGENCY DEPARTMENT Provider Note   CSN: [id number] Arrival date & time: 04/03/19  1920    History   Chief Complaint Chief Complaint  Patient presents with  . Fall    HPI David Lawson is a 68 y.o. male presents to the ER for evaluation of left eyebrow laceration, sustained after fall earlier today during work.  States he was going to the bathroom and slipped on the wet floor that had just been cleaned.  He slipped and fell and struck the left side of his face/eyebrow on the nearby toilet.  He had sudden onset of bleeding and local pain.  He did not lose consciousness.  He was able to stand up and clean the wound.  He then drove home approximately 45 minutes and noticed the wound was very large so he came to the ER.  Unknown last tetanus status.  He denies any significant amount of pain to the wound.  He denies any headache, vision changes or loss, vomiting, seizure-like activity, confusion, dizziness, neck pain after the fall.  No anticoagulants.  He denies any other physical injuries from the fall.  He denies difficulty with speech, balance, swallowing, unilateral weakness or numbness.     HPI  Past Medical History:  Diagnosis Date  . CKD (chronic kidney disease) stage 3, GFR 30-59 ml/min (HCC)   . DDD (degenerative disc disease)   . Gout   . Nephrolithiasis   . PMR (polymyalgia rheumatica) (HCC)   . Ureteral obstruction     Patient Active Problem List   Diagnosis Date Noted  . Trigger finger, right index finger 10/26/2016  . Acute renal failure ([employer])   . Gout   . Nephrolithiasis   . Ureteral obstruction     History reviewed. No pertinent surgical history.      Home Medications    Prior to Admission medications   Medication Sig Start Date End Date Taking? Authorizing Provider  albuterol (PROVENTIL HFA;VENTOLIN HFA) 108 (90 Base) MCG/ACT inhaler Inhale 2 puffs into the lungs every 4 (four) hours as needed for wheezing or shortness of breath. 02/07/18    Delsa Grana, PA-C  allopurinol (ZYLOPRIM) 100 MG tablet TAKE 2 TABLETS BY MOUTH DAILY GENERIC EQUIVALENT FOR ZYLOPRIM WILL NEED OFFICE VISIT BEFORE FURTHER REFILLS 03/11/19   Susy Frizzle, MD  diclofenac sodium (VOLTAREN) 1 % GEL Apply 2 g topically 4 (four) times daily. 08/20/17   Susy Frizzle, MD  glucosamine-chondroitin 500-400 MG tablet Take 1 tablet by mouth 3 (three) times daily.      [provider]  indomethacin (INDOCIN) 50 MG capsule Take 1 capsule (50 mg total) by mouth 2 (two) times daily with a meal. Prn Gout flare 03/25/15   Susy Frizzle, MD  Multiple Vitamins-Minerals (MULTIVITAMIN,TX-MINERALS) tablet Take 1 tablet by mouth daily.      [provider]  niacin 500 MG tablet Take 500 mg by mouth 2 (two) times daily with a meal.    [provider]    Family History No family history on file.  Social History Social History   Tobacco Use  . Smoking status: Never Smoker  . Smokeless tobacco: Never Used  Substance Use Topics  . Alcohol use: No  . Drug use: No     Allergies   Sulfa antibiotics   Review of Systems Review of Systems  Skin: Positive for wound.  All other systems reviewed and are negative.    Physical Exam Updated Vital Signs BP 128/71  Pulse 60   Temp 98.4 F (36.9 C) (Oral)   Resp 20   Ht 5\' 8"  (1.727 m)   Wt 78 kg   SpO2 98%   BMI 26.15 kg/m   Physical Exam Vitals signs and nursing note reviewed.  Constitutional:      General: He is not in acute distress.    Appearance: He is well-developed.     Comments: NAD.  HENT:     Head: Normocephalic.     Comments: Left eyebrow laceration with surrounding ecchymosis, oozing blood.    Right Ear: External ear normal.     Left Ear: External ear normal.     Ears:     Comments: No hemotympanum bilaterally.  No battle signs.    Nose: Nose normal.  Eyes:     General: No scleral icterus.    Conjunctiva/sclera: Conjunctivae normal.     Comments: 5 cm  laceration from the inner eyebrow across diagonally to the left upper eyelid almost to the end of the eyebrow.  Full EOMs bilaterally, painless without deficit.  Full movements of the eyebrows bilaterally without deficit.  Eyelid margins of the left eye intact.  No involvement of the tear ducts.  No evidence of full-thickness laceration.  No orbital fat prolapse.  Signs of orbital or globe injury. PERRL bilaterally. No racoon's eyes  Neck:     Musculoskeletal: Normal range of motion and neck supple.  Cardiovascular:     Rate and Rhythm: Normal rate and regular rhythm.     Heart sounds: Normal heart sounds. No murmur.  Pulmonary:     Effort: Pulmonary effort is normal.     Breath sounds: Normal breath sounds. No wheezing.  Musculoskeletal: Normal range of motion.        General: No deformity.  Skin:    General: Skin is warm and dry.     Capillary Refill: Capillary refill takes less than 2 seconds.     Comments: Subcutaneous tissue exposed, pearly gray likely fascia noted through gaping of laceration but no visualized involvement of muscle underneath   Neurological:     Mental Status: He is alert and oriented to person, place, and time.     Comments: Alert and oriented to self, place, time and event.  Speech is fluent without dysarthria or dysphasia. Strength 5/5 in upper/lower extremities Sensation to light touch intact in face, and upper/lower extremities  Normal gait No pronator drift. No leg drop. Normal finger-to-nose.  CN I not tested CN II grossly intact visual fields bilaterally. Unable to visualize posterior eye. CN III, IV, VI PEERL and EOMs intact bilaterally CN V light touch intact in all 3 divisions of trigeminal nerve CN VII facial movements symmetric CN VIII not tested CN IX, X no uvula deviation, symmetric rise of soft palate  CN XI 5/5 SCM and trapezius strength bilaterally  CN XII Midline tongue protrusion, symmetric L/R movements  Psychiatric:        Behavior:  Behavior normal.        Thought Content: Thought content normal.        Judgment: Judgment normal.      ED Treatments / Results  Labs (all labs ordered are listed, but only abnormal results are displayed) Labs Reviewed - No data to display  EKG None  Radiology No results found.  Procedures .Marland KitchenLaceration Repair  Date/Time: 04/03/2019 10:32 PM Performed by: Kinnie Feil, PA-C Authorized by: Kinnie Feil, PA-C   Consent:    Consent obtained:  Verbal   Consent given by:  Patient   Risks discussed:  Infection, need for additional repair, pain, poor cosmetic result and poor wound healing   Alternatives discussed:  No treatment, delayed treatment and referral Universal protocol:    Procedure explained and questions answered to patient or proxy's satisfaction: yes     Relevant documents present and verified: yes     Test results available and properly labeled: yes     Required blood products, implants, devices, and special equipment available: yes     Site/side marked: yes     Immediately prior to procedure, a time out was called: yes     Patient identity confirmed:  Verbally with patient and arm band Anesthesia (see MAR for exact dosages):    Anesthesia method:  Local infiltration   Local anesthetic:  Lidocaine 1% w/o epi Laceration details:    Location:  Face   Face location:  L eyebrow (left upper eyelid)   Length (cm):  5 Repair type:    Repair type:  Complex Pre-procedure details:    Preparation:  Patient was prepped and draped in usual sterile fashion Exploration:    Limited defect created (wound extended): no     Hemostasis achieved with:  Direct pressure   Wound exploration: wound explored through full range of motion and entire depth of wound probed and visualized     Wound extent: no muscle damage noted and no nerve damage noted     Contaminated: no   Treatment:    Area cleansed with:  Betadine and saline   Amount of cleaning:  Standard    Irrigation solution:  Sterile saline   Irrigation method:  Tap   Visualized foreign bodies/material removed: no     Debridement:  None   Undermining:  None   Scar revision: no   Skin repair:    Repair method:  Sutures   Suture size:  6-0   Wound skin closure material used: vicryl.   Suture technique:  Running   Number of sutures:  13 Approximation:    Approximation:  Close Post-procedure details:    Dressing:  Antibiotic ointment   Patient tolerance of procedure:  Tolerated well, no immediate complications   (including critical care time)  Medications Ordered in ED Medications  polymixin-bacitracin (POLYSPORIN) ointment (has no administration in time range)  Tdap (BOOSTRIX) injection 0.5 mL (0.5 mLs Intramuscular Given 04/03/19 2127)  lidocaine (PF) (XYLOCAINE) 1 % injection 5 mL (5 mLs Infiltration Given 04/03/19 2127)     Initial Impression / Assessment and Plan / ED Course  I have reviewed the triage vital signs and the nursing notes.  Pertinent labs & imaging results that were available during my care of the patient were reviewed by me and considered in my medical decision making (see chart for details).  68 here after mechanical fall with left upper eyelid laceration.  No signs of significant C-spine, facial or globe injury.  Exam is reassuring.  Given his age we discussed standard of care is to obtain head CT to rule out acute traumatic intracranial abnormalities.  Given his benign exam I have low suspicion for this.  Discussed indications, risks, benefits of CT scan today and ultimately patient opted to decline CT head today.  He verbalized understanding and assumed risks of declining head CT today including intracranial hemorrhage, fracture that could lead to potentially life-threatening process in the future.  Laceration was complex given the location and length.  He had full range of motion  of his eyebrows, EOMs pre-and post laceration repair.  No immediate complications  after the procedure.  He has no other signs of physical trauma and no other emergent imaging is indicated today.  Will DC with wound care instructions and follow-up for wound/suture evaluation.  Return precautions given.  Patient is comfortable with this plan.  Final Clinical Impressions(s) / ED Diagnoses   Final diagnoses:  Fall, initial encounter  Left eyelid laceration, initial encounter    ED Discharge Orders    None       Arlean Hopping 04/03/19 2239    Fredia Sorrow, MD 04/09/19 [phone]

## 2019-04-03 NOTE — ED Triage Notes (Signed)
Pt slipped on a wet floor, hitting eyebrow on the floor, c/o laceration noted to left eyebrow area, last tetanus was unknown, denies any LOC<

## 2019-04-10 DIAGNOSIS — H05222 Edema of left orbit: Secondary | ICD-10-CM | POA: Diagnosis not present

## 2019-04-10 DIAGNOSIS — S01112A Laceration without foreign body of left eyelid and periocular area, initial encounter: Secondary | ICD-10-CM | POA: Diagnosis not present

## 2019-04-10 DIAGNOSIS — H40053 Ocular hypertension, bilateral: Secondary | ICD-10-CM | POA: Diagnosis not present

## 2019-04-10 DIAGNOSIS — H35371 Puckering of macula, right eye: Secondary | ICD-10-CM | POA: Diagnosis not present

## 2019-04-10 DIAGNOSIS — S0512XA Contusion of eyeball and orbital tissues, left eye, initial encounter: Secondary | ICD-10-CM | POA: Diagnosis not present

## 2019-05-08 ENCOUNTER — Other Ambulatory Visit: Payer: Self-pay

## 2019-05-09 ENCOUNTER — Ambulatory Visit (INDEPENDENT_AMBULATORY_CARE_PROVIDER_SITE_OTHER): Payer: BC Managed Care – PPO | Admitting: Family Medicine

## 2019-05-09 ENCOUNTER — Other Ambulatory Visit: Payer: Self-pay

## 2019-05-09 ENCOUNTER — Encounter: Payer: Self-pay | Admitting: Family Medicine

## 2019-05-09 VITALS — BP 118/80 | HR 56 | Temp 97.7°F | Resp 15 | Ht 67.0 in | Wt 177.0 lb

## 2019-05-09 DIAGNOSIS — Z23 Encounter for immunization: Secondary | ICD-10-CM | POA: Diagnosis not present

## 2019-05-09 DIAGNOSIS — Z8739 Personal history of other diseases of the musculoskeletal system and connective tissue: Secondary | ICD-10-CM

## 2019-05-09 DIAGNOSIS — Z125 Encounter for screening for malignant neoplasm of prostate: Secondary | ICD-10-CM | POA: Diagnosis not present

## 2019-05-09 DIAGNOSIS — N183 Chronic kidney disease, stage 3 unspecified: Secondary | ICD-10-CM

## 2019-05-09 DIAGNOSIS — Z13220 Encounter for screening for lipoid disorders: Secondary | ICD-10-CM | POA: Diagnosis not present

## 2019-05-09 NOTE — Progress Notes (Signed)
Subjective:    Patient ID: David Lawson, male    DOB: [birth year], 68 y.o.   MRN: [id number]  HPI On July 30, the patient slipped and fell at work hitting his head right above his left on the toilet.  He sustained a laceration extending diagonally through his right eyebrow.  He went to the emergency room to have this laceration closed.  This laceration was closed using a running Vicryl suture.  Patient has numerous frayed ends of suture protruding through his skin and approximately 6 different locations where the running suture has dissolved leaving the frayed ends protruding from the skin.  He is here today to have this removed.  There is no cellulitis.  There is no swelling.  There is no pain.  The wound has healed beautifully with very little scarring.  Patient is very happy with how well the emergency room provider close the laceration.  It looks excellent.  Past Medical History:  Diagnosis Date  . CKD (chronic kidney disease) stage 3, GFR 30-59 ml/min (HCC)   . DDD (degenerative disc disease)   . Gout   . Nephrolithiasis   . PMR (polymyalgia rheumatica) (HCC)   . Ureteral obstruction    No past surgical history on file. Current Outpatient Medications on File Prior to Visit  Medication Sig Dispense Refill  . allopurinol (ZYLOPRIM) 100 MG tablet TAKE 2 TABLETS BY MOUTH DAILY GENERIC EQUIVALENT FOR ZYLOPRIM WILL NEED OFFICE VISIT BEFORE FURTHER REFILLS 180 tablet 0  . diclofenac sodium (VOLTAREN) 1 % GEL Apply 2 g topically 4 (four) times daily. 1 Tube 5  . glucosamine-chondroitin 500-400 MG tablet Take 1 tablet by mouth 3 (three) times daily.      . Multiple Vitamins-Minerals (MULTIVITAMIN,TX-MINERALS) tablet Take 1 tablet by mouth daily.      . niacin 500 MG tablet Take 500 mg by mouth 2 (two) times daily with a meal.    . albuterol (PROVENTIL HFA;VENTOLIN HFA) 108 (90 Base) MCG/ACT inhaler Inhale 2 puffs into the lungs every 4 (four) hours as needed for wheezing or shortness of breath.  (Patient not taking: Reported on 05/09/2019) 1 Inhaler 0  . indomethacin (INDOCIN) 50 MG capsule Take 1 capsule (50 mg total) by mouth 2 (two) times daily with a meal. Prn Gout flare (Patient not taking: Reported on 05/09/2019) 30 capsule 0   No current facility-administered medications on file prior to visit.    Allergies  Allergen Reactions  . Sulfa Antibiotics    Social History   Socioeconomic History  . Marital status: Married    Spouse name: Not on file  . Number of children: Not on file  . Years of education: Not on file  . Highest education level: Not on file  Occupational History  . Not on file  Social Needs  . Financial resource strain: Not on file  . Food insecurity    Worry: Not on file    Inability: Not on file  . Transportation needs    Medical: Not on file    Non-medical: Not on file  Tobacco Use  . Smoking status: Never Smoker  . Smokeless tobacco: Never Used  Substance and Sexual Activity  . Alcohol use: No  . Drug use: No  . Sexual activity: Not on file  Lifestyle  . Physical activity    Days per week: Not on file    Minutes per session: Not on file  . Stress: Not on file  Relationships  . Social connections  Talks on phone: Not on file    Gets together: Not on file    Attends religious service: Not on file    Active member of club or organization: Not on file    Attends meetings of clubs or organizations: Not on file    Relationship status: Not on file  . Intimate partner violence    Fear of current or ex partner: Not on file    Emotionally abused: Not on file    Physically abused: Not on file    Forced sexual activity: Not on file  Other Topics Concern  . Not on file  Social History Narrative  . Not on file      Review of Systems  All other systems reviewed and are negative.      Objective:   Physical Exam Vitals signs reviewed.  Eyes:   Cardiovascular:     Rate and Rhythm: Normal rate and regular rhythm.     Heart sounds:  Normal heart sounds.  Pulmonary:     Effort: Pulmonary effort is normal.     Breath sounds: Normal breath sounds.           Assessment & Plan:  Chronic kidney disease (CKD), stage III (moderate) (HCC) - Plan: CBC with Differential/Platelet, COMPLETE METABOLIC PANEL WITH GFR, Lipid panel  Screening cholesterol level - Plan: CBC with Differential/Platelet, COMPLETE METABOLIC PANEL WITH GFR, Lipid panel  Prostate cancer screening - Plan: PSA  History of gout - Plan: Uric acid  Need for immunization against influenza - Plan: Flu Vaccine QUAD High Dose(Fluad)  The diagram above shows the healed laceration with a pink line.  The red dots represent the Vicryl sutures protruding through the skin.  Using a pair of forceps I was able to remove the residual Vicryl with simple traction in each of these locations leaving no further protruding areas.  No further follow-up is necessary for this.  While the patient is here I would like to obtain lab work for his upcoming physical exam.  He has a history of chronic kidney disease and therefore I will check a CBC and a CMP.  I will screen for hyperlipidemia by checking a fasting lipid panel.  I will screen for prostate cancer by checking a PSA.  The patient has a history of gout is on allopurinol and therefore I will check a uric acid level as well.  He received his flu shot today

## 2019-05-10 LAB — CBC WITH DIFFERENTIAL/PLATELET
Absolute Monocytes: 392 cells/uL (ref 200–950)
Basophils Absolute: 32 cells/uL (ref 0–200)
Basophils Relative: 0.8 %
Eosinophils Absolute: 112 cells/uL (ref 15–500)
Eosinophils Relative: 2.8 %
HCT: 41 % (ref 38.5–50.0)
Hemoglobin: 13.9 g/dL (ref 13.2–17.1)
Lymphs Abs: 1292 cells/uL (ref 850–3900)
MCH: 32.4 pg (ref 27.0–33.0)
MCHC: 33.9 g/dL (ref 32.0–36.0)
MCV: 95.6 fL (ref 80.0–100.0)
MPV: 10.6 fL (ref 7.5–12.5)
Monocytes Relative: 9.8 %
Neutro Abs: 2172 cells/uL (ref 1500–7800)
Neutrophils Relative %: 54.3 %
Platelets: 169 10*3/uL (ref 140–400)
RBC: 4.29 10*6/uL (ref 4.20–5.80)
RDW: 12.6 % (ref 11.0–15.0)
Total Lymphocyte: 32.3 %
WBC: 4 10*3/uL (ref 3.8–10.8)

## 2019-05-10 LAB — COMPLETE METABOLIC PANEL WITH GFR
AG Ratio: 2.4 (calc) (ref 1.0–2.5)
ALT: 21 U/L (ref 9–46)
AST: 29 U/L (ref 10–35)
Albumin: 4.3 g/dL (ref 3.6–5.1)
Alkaline phosphatase (APISO): 78 U/L (ref 35–144)
BUN/Creatinine Ratio: 15 (calc) (ref 6–22)
BUN: 32 mg/dL — ABNORMAL HIGH (ref 7–25)
CO2: 23 mmol/L (ref 20–32)
Calcium: 9.3 mg/dL (ref 8.6–10.3)
Chloride: 108 mmol/L (ref 98–110)
Creat: 2.07 mg/dL — ABNORMAL HIGH (ref 0.70–1.25)
GFR, Est African American: 37 mL/min/{1.73_m2} — ABNORMAL LOW (ref >=60)
GFR, Est Non African American: 32 mL/min/{1.73_m2} — ABNORMAL LOW (ref >=60)
Globulin: 1.8 g/dL (calc) — ABNORMAL LOW (ref 1.9–3.7)
Glucose, Bld: 94 mg/dL (ref 65–99)
Potassium: 5.4 mmol/L — ABNORMAL HIGH (ref 3.5–5.3)
Sodium: 141 mmol/L (ref 135–146)
Total Bilirubin: 0.5 mg/dL (ref 0.2–1.2)
Total Protein: 6.1 g/dL (ref 6.1–8.1)

## 2019-05-10 LAB — LIPID PANEL
Cholesterol: 158 mg/dL (ref <200)
HDL: 43 mg/dL (ref >=40)
LDL Cholesterol (Calc): 102 mg/dL (calc) — ABNORMAL HIGH
Non-HDL Cholesterol (Calc): 115 mg/dL (calc) (ref <130)
Total CHOL/HDL Ratio: 3.7 (calc) (ref <5.0)
Triglycerides: 43 mg/dL (ref <150)

## 2019-05-10 LAB — URIC ACID: Uric Acid, Serum: 5.2 mg/dL (ref 4.0–8.0)

## 2019-05-10 LAB — PSA: PSA: 1.5 ng/mL (ref <=4.0)

## 2019-05-29 ENCOUNTER — Other Ambulatory Visit: Payer: Self-pay | Admitting: Family Medicine

## 2019-06-06 ENCOUNTER — Other Ambulatory Visit: Payer: Self-pay

## 2019-06-09 ENCOUNTER — Encounter: Payer: Self-pay | Admitting: Family Medicine

## 2019-06-09 ENCOUNTER — Telehealth: Payer: Self-pay | Admitting: *Deleted

## 2019-06-09 ENCOUNTER — Other Ambulatory Visit: Payer: Self-pay

## 2019-06-09 ENCOUNTER — Ambulatory Visit (INDEPENDENT_AMBULATORY_CARE_PROVIDER_SITE_OTHER): Payer: BC Managed Care – PPO | Admitting: Family Medicine

## 2019-06-09 VITALS — BP 100/62 | HR 70 | Temp 97.7°F | Resp 14 | Ht 67.0 in | Wt 179.0 lb

## 2019-06-09 DIAGNOSIS — Z0000 Encounter for general adult medical examination without abnormal findings: Secondary | ICD-10-CM

## 2019-06-09 DIAGNOSIS — Z23 Encounter for immunization: Secondary | ICD-10-CM | POA: Diagnosis not present

## 2019-06-09 DIAGNOSIS — Z13220 Encounter for screening for lipoid disorders: Secondary | ICD-10-CM | POA: Diagnosis not present

## 2019-06-09 DIAGNOSIS — Z8739 Personal history of other diseases of the musculoskeletal system and connective tissue: Secondary | ICD-10-CM

## 2019-06-09 DIAGNOSIS — Z1159 Encounter for screening for other viral diseases: Secondary | ICD-10-CM

## 2019-06-09 DIAGNOSIS — Z1211 Encounter for screening for malignant neoplasm of colon: Secondary | ICD-10-CM

## 2019-06-09 DIAGNOSIS — Z0001 Encounter for general adult medical examination with abnormal findings: Secondary | ICD-10-CM | POA: Diagnosis not present

## 2019-06-09 DIAGNOSIS — R195 Other fecal abnormalities: Secondary | ICD-10-CM

## 2019-06-09 DIAGNOSIS — N1832 Chronic kidney disease, stage 3b: Secondary | ICD-10-CM | POA: Diagnosis not present

## 2019-06-09 DIAGNOSIS — Z125 Encounter for screening for malignant neoplasm of prostate: Secondary | ICD-10-CM

## 2019-06-09 MED ORDER — COLCHICINE 0.6 MG PO TABS: 0.6000 mg | ORAL_TABLET | Freq: Every day | ORAL | 1 refills | Status: DC

## 2019-06-09 NOTE — Addendum Note (Signed)
Addended by: Shary Decamp B on: 06/09/2019 11:16 AM   Modules accepted: Orders

## 2019-06-09 NOTE — Telephone Encounter (Signed)
-----   Message from Alyson Locket, [place] sent at 06/09/2019  9:21 AM EDT -----  ----- Message ----- From: Susy Frizzle, MD Sent: 06/09/2019   8:56 AM EDT To: Alyson Locket, RMA  Please schedule cologuard

## 2019-06-09 NOTE — Telephone Encounter (Signed)
Received verbal orders for Cologuard.   Order placed via [facility].   Cologuard (Order TA:[id number])

## 2019-06-09 NOTE — Progress Notes (Signed)
Subjective:    Patient ID: David Lawson, male    DOB: [birth year], 68 y.o.   MRN: [id number]  HPI  Patient is here today for a complete physical exam.  He had his flu shot at his last clinical visit.  He denies any concerns.  It is been more than 10 years since his last colonoscopy.  He declines a colonoscopy but would be willing to consent for Cologuard.  He is due for a PSA to check for prostate cancer.  He is due for Pneumovax 23 as well as Prevnar 13.  He is due for Shingrix.  He is also due to be screened for hepatitis C.  He denies any depression.  He denies any falls.  He denies any memory loss. Immunization History  Administered Date(s) Administered  . Fluad Quad(high Dose 65+) 05/09/2019  . Influenza, High Dose Seasonal PF 07/06/2017  . Influenza-Unspecified 08/03/2016, 07/04/2017  . Tdap 03/12/2018, 04/03/2019   No visits with results within 1 Month(s) from this visit.  Latest known visit with results is:  Office Visit on 05/09/2019  Component Date Value Ref Range Status  . WBC 05/09/2019 4.0  3.8 - 10.8 Thousand/uL Final  . RBC 05/09/2019 4.29  4.20 - 5.80 Million/uL Final  . Hemoglobin 05/09/2019 13.9  13.2 - 17.1 g/dL Final  . HCT 05/09/2019 41.0  38.5 - 50.0 % Final  . MCV 05/09/2019 95.6  80.0 - 100.0 fL Final  . MCH 05/09/2019 32.4  27.0 - 33.0 pg Final  . MCHC 05/09/2019 33.9  32.0 - 36.0 g/dL Final  . RDW 05/09/2019 12.6  11.0 - 15.0 % Final  . Platelets 05/09/2019 169  140 - 400 Thousand/uL Final  . MPV 05/09/2019 10.6  7.5 - 12.5 fL Final  . Neutro Abs 05/09/2019 2,172  1,500 - 7,800 cells/uL Final  . Lymphs Abs 05/09/2019 1,292  850 - 3,900 cells/uL Final  . Absolute Monocytes 05/09/2019 392  200 - 950 cells/uL Final  . Eosinophils Absolute 05/09/2019 112  15 - 500 cells/uL Final  . Basophils Absolute 05/09/2019 32  0 - 200 cells/uL Final  . Neutrophils Relative % 05/09/2019 54.3  % Final  . Total Lymphocyte 05/09/2019 32.3  % Final  . Monocytes Relative  05/09/2019 9.8  % Final  . Eosinophils Relative 05/09/2019 2.8  % Final  . Basophils Relative 05/09/2019 0.8  % Final  . Glucose, Bld 05/09/2019 94  65 - 99 mg/dL Final   Comment: .            Fasting reference interval .   . BUN 05/09/2019 32* 7 - 25 mg/dL Final  . Creat 05/09/2019 2.07* 0.70 - 1.25 mg/dL Final   Comment: For patients >29 years of age, the reference limit for Creatinine is approximately 13% higher for people identified as African-American. .   . GFR, Est Non African American 05/09/2019 32* > OR = 60 mL/min/1.48m2 Final  . GFR, Est African American 05/09/2019 37* > OR = 60 mL/min/1.34m2 Final  . BUN/Creatinine Ratio 05/09/2019 15  6 - 22 (calc) Final  . Sodium 05/09/2019 141  135 - 146 mmol/L Final  . Potassium 05/09/2019 5.4* 3.5 - 5.3 mmol/L Final  . Chloride 05/09/2019 108  98 - 110 mmol/L Final  . CO2 05/09/2019 23  20 - 32 mmol/L Final  . Calcium 05/09/2019 9.3  8.6 - 10.3 mg/dL Final  . Total Protein 05/09/2019 6.1  6.1 - 8.1 g/dL Final  . Albumin 05/09/2019 4.3  3.6 - 5.1 g/dL Final  . Globulin 05/09/2019 1.8* 1.9 - 3.7 g/dL (calc) Final  . AG Ratio 05/09/2019 2.4  1.0 - 2.5 (calc) Final  . Total Bilirubin 05/09/2019 0.5  0.2 - 1.2 mg/dL Final  . Alkaline phosphatase (APISO) 05/09/2019 78  35 - 144 U/L Final  . AST 05/09/2019 29  10 - 35 U/L Final  . ALT 05/09/2019 21  9 - 46 U/L Final  . Cholesterol 05/09/2019 158  <200 mg/dL Final  . HDL 05/09/2019 43  > OR = 40 mg/dL Final  . Triglycerides 05/09/2019 43  <150 mg/dL Final  . LDL Cholesterol (Calc) 05/09/2019 102* mg/dL (calc) Final   Comment: Reference range: <100 . Desirable range <100 mg/dL for primary prevention;   <70 mg/dL for patients with CHD or diabetic patients  with > or = 2 CHD risk factors. Marland Kitchen LDL-C is now calculated using the Martin-Hopkins  calculation, which is a validated novel method providing  better accuracy than the Friedewald equation in the  estimation of LDL-C.  Cresenciano Genre et  al. Annamaria Helling. [id number]): 2061-2068  (http://education.[facility].com/faq/FAQ164)   . Total CHOL/HDL Ratio 05/09/2019 3.7  <5.0 (calc) Final  . Non-HDL Cholesterol (Calc) 05/09/2019 115  <130 mg/dL (calc) Final   Comment: For patients with diabetes plus 1 major ASCVD risk  factor, treating to a non-HDL-C goal of <100 mg/dL  (LDL-C of <70 mg/dL) is considered a therapeutic  option.   Marland Kitchen PSA 05/09/2019 1.5  < OR = 4.0 ng/mL Final   Comment: The total PSA value from this assay system is  standardized against the WHO standard. The test  result will be approximately 20% lower when compared  to the equimolar-standardized total PSA (Beckman  Coulter). Comparison of serial PSA results should be  interpreted with this fact in mind. . This test was performed using the Siemens  chemiluminescent method. Values obtained from  different assay methods cannot be used interchangeably. PSA levels, regardless of value, should not be interpreted as absolute evidence of the presence or absence of disease.   Marland Kitchen Uric Acid, Serum 05/09/2019 5.2  4.0 - 8.0 mg/dL Final   Comment: Therapeutic target for gout patients: <6.0 mg/dL .      Past Medical History:  Diagnosis Date  . CKD (chronic kidney disease) stage 3, GFR 30-59 ml/min   . DDD (degenerative disc disease)   . Gout   . Nephrolithiasis   . PMR (polymyalgia rheumatica) (HCC)   . Ureteral obstruction    No past surgical history on file. Current Outpatient Medications on File Prior to Visit  Medication Sig Dispense Refill  . allopurinol (ZYLOPRIM) 100 MG tablet TAKE 2 TABLETS BY MOUTH DAILY GENERIC EQUIVALENT FOR ZYLOPRIM WILL NEED OFFICE VISIT BEFORE FURTHER REFILLS 180 tablet 0  . glucosamine-chondroitin 500-400 MG tablet Take 1 tablet by mouth 3 (three) times daily.      . Multiple Vitamins-Minerals (MULTIVITAMIN,TX-MINERALS) tablet Take 1 tablet by mouth daily.      . niacin 500 MG tablet Take 500 mg by mouth 2 (two) times daily with a  meal.    . indomethacin (INDOCIN) 50 MG capsule Take 1 capsule (50 mg total) by mouth 2 (two) times daily with a meal. Prn Gout flare (Patient not taking: Reported on 05/09/2019) 30 capsule 0   No current facility-administered medications on file prior to visit.    Allergies  Allergen Reactions  . Sulfa Antibiotics    Social History   Socioeconomic History  . Marital  status: Married    Spouse name: Not on file  . Number of children: Not on file  . Years of education: Not on file  . Highest education level: Not on file  Occupational History  . Not on file  Social Needs  . Financial resource strain: Not on file  . Food insecurity    Worry: Not on file    Inability: Not on file  . Transportation needs    Medical: Not on file    Non-medical: Not on file  Tobacco Use  . Smoking status: Never Smoker  . Smokeless tobacco: Never Used  Substance and Sexual Activity  . Alcohol use: No  . Drug use: No  . Sexual activity: Not on file  Lifestyle  . Physical activity    Days per week: Not on file    Minutes per session: Not on file  . Stress: Not on file  Relationships  . Social Herbalist on phone: Not on file    Gets together: Not on file    Attends religious service: Not on file    Active member of club or organization: Not on file    Attends meetings of clubs or organizations: Not on file    Relationship status: Not on file  . Intimate partner violence    Fear of current or ex partner: Not on file    Emotionally abused: Not on file    Physically abused: Not on file    Forced sexual activity: Not on file  Other Topics Concern  . Not on file  Social History Narrative  . Not on file   No family history on file.    Review of Systems     Objective:   Physical Exam Vitals signs reviewed.  Constitutional:      General: He is not in acute distress.    Appearance: Normal appearance. He is normal weight. He is not ill-appearing, toxic-appearing or diaphoretic.   HENT:     Head: Normocephalic and atraumatic.     Right Ear: Tympanic membrane and ear canal normal. There is no impacted cerumen.     Left Ear: Tympanic membrane and ear canal normal. There is no impacted cerumen.     Nose: Nose normal. No congestion or rhinorrhea.     Mouth/Throat:     Mouth: Mucous membranes are moist.     Pharynx: No oropharyngeal exudate or posterior oropharyngeal erythema.  Eyes:     General: No scleral icterus.       Right eye: No discharge.        Left eye: No discharge.     Extraocular Movements: Extraocular movements intact.     Conjunctiva/sclera: Conjunctivae normal.     Pupils: Pupils are equal, round, and reactive to light.  Neck:     Musculoskeletal: Normal range of motion and neck supple.     Vascular: No carotid bruit.  Cardiovascular:     Rate and Rhythm: Normal rate and regular rhythm.     Pulses: Normal pulses.     Heart sounds: Normal heart sounds. No murmur. No friction rub. No gallop.   Pulmonary:     Effort: Pulmonary effort is normal. No respiratory distress.     Breath sounds: Normal breath sounds. No stridor. No wheezing, rhonchi or rales.  Chest:     Chest wall: No tenderness.  Abdominal:     General: Abdomen is flat. Bowel sounds are normal. There is no distension.  Palpations: Abdomen is soft.     Tenderness: There is no abdominal tenderness. There is no guarding or rebound.  Musculoskeletal: Normal range of motion.        General: No swelling or deformity.     Right lower leg: No edema.  Lymphadenopathy:     Cervical: No cervical adenopathy.  Skin:    General: Skin is warm.     Coloration: Skin is not jaundiced or pale.     Findings: No bruising, erythema, lesion or rash.  Neurological:     General: No focal deficit present.     Mental Status: He is alert and oriented to person, place, and time. Mental status is at baseline.     Cranial Nerves: No cranial nerve deficit.     Sensory: No sensory deficit.     Motor: No  weakness.     Coordination: Coordination normal.     Gait: Gait normal.  Psychiatric:        Mood and Affect: Mood normal.        Behavior: Behavior normal.        Thought Content: Thought content normal.        Judgment: Judgment normal.           Assessment & Plan:  Encounter for hepatitis C screening test for low risk patient - Plan: Hepatitis C Antibody  General medical exam  Stage 3b chronic kidney disease  Screening cholesterol level  Prostate cancer screening  History of gout  Physical exam today is completely normal.  Recommended avoiding all NSAIDs due to his chronic kidney disease.  Recommend watching his creatinine every 4 to 6 months to keep a closer eye on it and if worsening referred to nephrology.  Recommended using colchicine as needed for gout flares rather than indomethacin.  Patient received Pneumovax 23.  He will receive Prevnar 13 next year.  Recommended Shingrix vaccine.  Flu shot is up-to-date.  Recommended Cologuard and I will schedule this.  Prostate cancer screening is normal with a PSA of 1.5.  Reviewed the remainder of his lab work including a CBC and fasting lipid panel which were within normal limits.

## 2019-06-10 ENCOUNTER — Other Ambulatory Visit: Payer: Self-pay | Admitting: Family Medicine

## 2019-06-10 LAB — HEPATITIS C ANTIBODY
Hepatitis C Ab: NONREACTIVE
SIGNAL TO CUT-OFF: 0.01 (ref <1.00)

## 2019-06-10 MED ORDER — COLCHICINE 0.6 MG PO CAPS: 0.6000 mg | ORAL_CAPSULE | Freq: Every day | ORAL | 3 refills | Status: AC

## 2019-06-11 ENCOUNTER — Encounter: Payer: Self-pay | Admitting: Family Medicine

## 2019-07-14 ENCOUNTER — Encounter: Payer: Self-pay | Admitting: Family Medicine

## 2019-07-14 DIAGNOSIS — Z1212 Encounter for screening for malignant neoplasm of rectum: Secondary | ICD-10-CM | POA: Diagnosis not present

## 2019-07-14 DIAGNOSIS — Z1211 Encounter for screening for malignant neoplasm of colon: Secondary | ICD-10-CM | POA: Diagnosis not present

## 2019-07-18 ENCOUNTER — Other Ambulatory Visit: Payer: Self-pay

## 2019-07-18 ENCOUNTER — Encounter: Payer: Self-pay | Admitting: Family Medicine

## 2019-07-18 ENCOUNTER — Ambulatory Visit (INDEPENDENT_AMBULATORY_CARE_PROVIDER_SITE_OTHER): Payer: BC Managed Care – PPO | Admitting: Family Medicine

## 2019-07-18 VITALS — BP 110/64 | HR 62 | Temp 97.3°F | Resp 16 | Ht 67.0 in | Wt 179.0 lb

## 2019-07-18 DIAGNOSIS — M7022 Olecranon bursitis, left elbow: Secondary | ICD-10-CM

## 2019-07-18 NOTE — Progress Notes (Signed)
Subjective:    Patient ID: David Lawson, male    DOB: May 01, 1951, 69 y.o.   MRN: [id number]  HPI  Patient reports a 1 month history of painless swelling in the olecranon bursa of his left elbow.  He has a fluid-filled sac directly over the olecranon process.  There is no erythema.  There is no warmth.  There is no pain with range of motion.  He has full and painless range of motion in his left elbow.  He denies any trauma.  He does have a past history of gout however there is no evidence of a gout exacerbation.  Patient has allow time to pass hoping that the fluid would resorb on its own.  Unfortunately it has not.  He is requesting treatment today. Past Medical History:  Diagnosis Date  . CKD (chronic kidney disease) stage 3, GFR 30-59 ml/min   . DDD (degenerative disc disease)   . Gout   . Nephrolithiasis   . PMR (polymyalgia rheumatica) (HCC)   . Ureteral obstruction    No past surgical history on file. Current Outpatient Medications on File Prior to Visit  Medication Sig Dispense Refill  . allopurinol (ZYLOPRIM) 100 MG tablet TAKE 2 TABLETS BY MOUTH DAILY GENERIC EQUIVALENT FOR ZYLOPRIM WILL NEED OFFICE VISIT BEFORE FURTHER REFILLS 180 tablet 0  . Colchicine (MITIGARE) 0.6 MG CAPS Take 0.6 mg by mouth daily. (Patient taking differently: Take 0.6 mg by mouth daily. PRN Gout flare) 30 capsule 3  . glucosamine-chondroitin 500-400 MG tablet Take 1 tablet by mouth 3 (three) times daily.      . Multiple Vitamins-Minerals (MULTIVITAMIN,TX-MINERALS) tablet Take 1 tablet by mouth daily.      . niacin 500 MG tablet Take 500 mg by mouth 2 (two) times daily with a meal.     No current facility-administered medications on file prior to visit.    Allergies  Allergen Reactions  . Sulfa Antibiotics    Social History   Socioeconomic History  . Marital status: Married    Spouse name: Not on file  . Number of children: Not on file  . Years of education: Not on file  . Highest education  level: Not on file  Occupational History  . Not on file  Social Needs  . Financial resource strain: Not on file  . Food insecurity    Worry: Not on file    Inability: Not on file  . Transportation needs    Medical: Not on file    Non-medical: Not on file  Tobacco Use  . Smoking status: Never Smoker  . Smokeless tobacco: Never Used  Substance and Sexual Activity  . Alcohol use: No  . Drug use: No  . Sexual activity: Not on file  Lifestyle  . Physical activity    Days per week: Not on file    Minutes per session: Not on file  . Stress: Not on file  Relationships  . Social Herbalist on phone: Not on file    Gets together: Not on file    Attends religious service: Not on file    Active member of club or organization: Not on file    Attends meetings of clubs or organizations: Not on file    Relationship status: Not on file  . Intimate partner violence    Fear of current or ex partner: Not on file    Emotionally abused: Not on file    Physically abused: Not on file  Forced sexual activity: Not on file  Other Topics Concern  . Not on file  Social History Narrative  . Not on file     Review of Systems  All other systems reviewed and are negative.      Objective:   Physical Exam Vitals signs reviewed.  Constitutional:      Appearance: Normal appearance.  Cardiovascular:     Rate and Rhythm: Normal rate and regular rhythm.     Heart sounds: Normal heart sounds.  Pulmonary:     Effort: Pulmonary effort is normal.     Breath sounds: Normal breath sounds.  Musculoskeletal:     Left elbow: He exhibits swelling and deformity. He exhibits normal range of motion and no effusion. No tenderness found. No radial head, no medial epicondyle and no lateral epicondyle tenderness noted.  Neurological:     Mental Status: He is alert.           Assessment & Plan:  Olecranon bursitis of left elbow  Skin was prepped in sterile fashion with Betadine.  The skin  was anesthetized over the olecranon process was 0.1% lidocaine with epinephrine.  An 18-gauge needle was then inserted into the olecranon bursa and with aspiration, 12 cc of bloody serous fluid was aspirated from the olecranon bursa.  The needle was removed and an additional estimated 5 cc of bloody serous fluid drained from the puncture site.  The skin was then cleaned again thoroughly with Betadine and 1 cc of 40 mg/mL Kenalog was injected into the olecranon bursa using sterile technique.  A pressure dressing was applied.  Recommended daily dressing changes to keep pressure on the affected area for the least the next 3 to 4 days to prevent reaccumulation.  Reassess if no better in 1 week or sooner if worsening.  Advised the patient to monitor for any infection.

## 2019-07-21 LAB — COLOGUARD: Cologuard: POSITIVE — AB

## 2019-07-21 NOTE — Addendum Note (Signed)
Addended by: Sheral Flow on: 07/21/2019 03:25 PM   Modules accepted: Orders

## 2019-07-21 NOTE — Telephone Encounter (Signed)
Received Cologuard results.   Noted Cologuard results positive.   Call placed to patient and patient made aware.   Agreeable to GI referral for colonoscopy.   Referral orders placed.

## 2019-07-23 ENCOUNTER — Encounter: Payer: Self-pay | Admitting: Internal Medicine

## 2019-08-14 ENCOUNTER — Other Ambulatory Visit: Payer: Self-pay

## 2019-08-14 DIAGNOSIS — Z20822 Contact with and (suspected) exposure to covid-19: Secondary | ICD-10-CM

## 2019-08-15 LAB — NOVEL CORONAVIRUS, NAA: SARS-CoV-2, NAA: DETECTED — AB

## 2019-08-17 ENCOUNTER — Telehealth: Payer: Self-pay | Admitting: Unknown Physician Specialty

## 2019-08-17 NOTE — Telephone Encounter (Signed)
Called to discuss with patient about Covid symptoms and the use of bamlanivimab, a monoclonal antibody infusion for those with mild to moderate Covid symptoms and at a high risk of hospitalization.  Pt is qualified for this infusion at the [facility] infusion center due to Age > 65   Left message to call back

## 2019-08-18 ENCOUNTER — Other Ambulatory Visit: Payer: Self-pay | Admitting: Family Medicine

## 2019-08-18 ENCOUNTER — Telehealth: Payer: Self-pay | Admitting: Unknown Physician Specialty

## 2019-08-18 MED ORDER — CIPROFLOXACIN HCL 250 MG PO TABS: 250.0000 mg | ORAL_TABLET | Freq: Two times a day (BID) | ORAL | 0 refills | Status: DC

## 2019-08-18 NOTE — Telephone Encounter (Signed)
Discussed with patient about Covid symptoms and the use of bamlanivimab, a monoclonal antibody infusion for those with mild to moderate Covid symptoms and at a high risk of hospitalization.  Pt is not qualified for this infusion at the [facility] infusion center due to symptoms greater than 10 days

## 2019-08-25 ENCOUNTER — Other Ambulatory Visit: Payer: Self-pay

## 2019-08-25 ENCOUNTER — Ambulatory Visit: Payer: BC Managed Care – PPO | Attending: Internal Medicine

## 2019-08-25 DIAGNOSIS — Z20828 Contact with and (suspected) exposure to other viral communicable diseases: Secondary | ICD-10-CM | POA: Diagnosis not present

## 2019-08-25 DIAGNOSIS — Z20822 Contact with and (suspected) exposure to covid-19: Secondary | ICD-10-CM

## 2019-08-26 LAB — NOVEL CORONAVIRUS, NAA: SARS-CoV-2, NAA: NOT DETECTED

## 2019-09-03 ENCOUNTER — Encounter: Payer: BC Managed Care – PPO | Admitting: Internal Medicine

## 2019-09-12 ENCOUNTER — Other Ambulatory Visit: Payer: Self-pay | Admitting: Family Medicine

## 2019-09-13 ENCOUNTER — Other Ambulatory Visit: Payer: Self-pay | Admitting: Family Medicine

## 2019-09-24 ENCOUNTER — Other Ambulatory Visit: Payer: Self-pay

## 2019-09-24 ENCOUNTER — Ambulatory Visit (AMBULATORY_SURGERY_CENTER): Payer: Self-pay | Admitting: *Deleted

## 2019-09-24 VITALS — Temp 97.2°F | Ht 67.5 in | Wt 180.6 lb

## 2019-09-24 DIAGNOSIS — R195 Other fecal abnormalities: Secondary | ICD-10-CM

## 2019-09-24 NOTE — Progress Notes (Signed)
Pt had covid on 08-25-19- no need for covid test  No egg or soy allergy  No home oxygen use or problems with anesthesia  No medications for weight loss taken  emmi information given  Pt is aware that care partner will wait in the car during procedure; if they feel like they will be too hot or cold to wait in the car; they may wait in the 4 th floor lobby. Patient is aware to bring only one care partner. We want them to wear a mask (we do not have any that we can provide them), practice social distancing, and we will check their temperatures when they get here.  I did remind the patient that their care partner needs to stay in the parking lot the entire time and have a cell phone available, we will call them when the pt is ready for discharge. Patient will wear mask into building.

## 2019-10-08 ENCOUNTER — Ambulatory Visit (AMBULATORY_SURGERY_CENTER): Payer: BC Managed Care – PPO | Admitting: Internal Medicine

## 2019-10-08 ENCOUNTER — Encounter: Payer: Self-pay | Admitting: Internal Medicine

## 2019-10-08 ENCOUNTER — Other Ambulatory Visit: Payer: Self-pay

## 2019-10-08 VITALS — BP 94/64 | HR 61 | Temp 98.2°F | Resp 21 | Ht 67.0 in | Wt 180.0 lb

## 2019-10-08 DIAGNOSIS — R195 Other fecal abnormalities: Secondary | ICD-10-CM

## 2019-10-08 DIAGNOSIS — Z1211 Encounter for screening for malignant neoplasm of colon: Secondary | ICD-10-CM | POA: Diagnosis not present

## 2019-10-08 MED ORDER — SODIUM CHLORIDE 0.9 % IV SOLN: 500.0000 mL | Freq: Once | INTRAVENOUS | Status: DC

## 2019-10-08 NOTE — Progress Notes (Signed)
Temp check by:JB Vital check by:KA  The patient states no changes in medical or surgical history since pre-visit screening on 09/24/19.

## 2019-10-08 NOTE — Patient Instructions (Addendum)
I found swollen hemorrhoids - all else normal.  I suspect these leaked blood and caused the Cologuard test to be positive.  You do not need further routine colon cancer screening as colonoscopy good for 10 years and not typically doing the routine testing after age 68.  If you have hemorrhoid problems (swelling, itching, bleeding) I am able to treat those with an in-office procedure. If you like, please call my office at [phone] to schedule an appointment and I can evaluate you further.  I appreciate the opportunity to care for you. Gatha Mayer, MD, FACG  YOU HAD AN ENDOSCOPIC PROCEDURE TODAY AT [facility]:   Refer to the procedure report that was given to you for any specific questions about what was found during the examination.  If the procedure report does not answer your questions, please call your gastroenterologist to clarify.  If you requested that your care partner not be given the details of your procedure findings, then the procedure report has been included in a sealed envelope for you to review at your convenience later.  YOU SHOULD EXPECT: Some feelings of bloating in the abdomen. Passage of more gas than usual.  Walking can help get rid of the air that was put into your GI tract during the procedure and reduce the bloating. If you had a lower endoscopy (such as a colonoscopy or flexible sigmoidoscopy) you may notice spotting of blood in your stool or on the toilet paper. If you underwent a bowel prep for your procedure, you may not have a normal bowel movement for a few days.  Please Note:  You might notice some irritation and congestion in your nose or some drainage.  This is from the oxygen used during your procedure.  There is no need for concern and it should clear up in a day or so.  SYMPTOMS TO REPORT IMMEDIATELY:   Following lower endoscopy (colonoscopy or flexible sigmoidoscopy):  Excessive amounts of blood in the stool  Significant tenderness or  worsening of abdominal pains  Swelling of the abdomen that is new, acute  Fever of 100F or higher   For urgent or emergent issues, a gastroenterologist can be reached at any hour by calling [phone].   DIET:  We do recommend a small meal at first, but then you may proceed to your regular diet.  Drink plenty of fluids but you should avoid alcoholic beverages for 24 hours.  ACTIVITY:  You should plan to take it easy for the rest of today and you should NOT DRIVE or use heavy machinery until tomorrow (because of the sedation medicines used during the test).    FOLLOW UP: Our staff will call the number listed on your records 48-72 hours following your procedure to check on you and address any questions or concerns that you may have regarding the information given to you following your procedure. If we do not reach you, we will leave a message.  We will attempt to reach you two times.  During this call, we will ask if you have developed any symptoms of COVID 19. If you develop any symptoms (ie: fever, flu-like symptoms, shortness of breath, cough etc.) before then, please call [phone].  If you test positive for Covid 19 in the 2 weeks post procedure, please call and report this information to [place].    If any biopsies were taken you will be contacted by phone or by letter within the next 1-3 weeks.  Please call us  at [phone] if you have not heard about the biopsies in 3 weeks.    SIGNATURES/CONFIDENTIALITY: You and/or your care partner have signed paperwork which will be entered into your electronic medical record.  These signatures attest to the fact that that the information above on your After Visit Summary has been reviewed and is understood.  Full responsibility of the confidentiality of this discharge information lies with you and/or your care-partner.

## 2019-10-08 NOTE — Progress Notes (Signed)
To PACU, VSS. Report to [email or web address] 

## 2019-10-08 NOTE — Op Note (Signed)
[facility] Patient Name: David Lawson Procedure Date: 10/08/2019 10:47 AM MRN: [id number] Endoscopist: Gatha Mayer , MD Age: 69 Referring MD:  Date of Birth: 04/03/1951 Gender: Male Account #: [id number] Procedure:                Colonoscopy Indications:              Positive Cologuard test Medicines:                Propofol per Anesthesia, Monitored Anesthesia Care Procedure:                Pre-Anesthesia Assessment:                           - Prior to the procedure, a History and Physical                            was performed, and patient medications and                            allergies were reviewed. The patient's tolerance of                            previous anesthesia was also reviewed. The risks                            and benefits of the procedure and the sedation                            options and risks were discussed with the patient.                            All questions were answered, and informed consent                            was obtained. Prior Anticoagulants: The patient has                            taken no previous anticoagulant or antiplatelet                            agents. ASA Grade Assessment: II - A patient with                            mild systemic disease. After reviewing the risks                            and benefits, the patient was deemed in                            satisfactory condition to undergo the procedure.                           After obtaining informed consent, the colonoscope  was passed under direct vision. Throughout the                            procedure, the patient's blood pressure, pulse, and                            oxygen saturations were monitored continuously. The                            Colonoscope was introduced through the anus and                            advanced to the the cecum, identified by                            appendiceal orifice and  ileocecal valve. The                            colonoscopy was performed without difficulty. The                            patient tolerated the procedure well. The quality                            of the bowel preparation was excellent. The bowel                            preparation used was Miralax via split dose                            instruction. The ileocecal valve, appendiceal                            orifice, and rectum were photographed. Scope In: 10:59:22 AM Scope Out: 11:15:53 AM Scope Withdrawal Time: 0 hours 11 minutes 10 seconds  Total Procedure Duration: 0 hours 16 minutes 31 seconds  Findings:                 The perianal and digital rectal examinations were                            normal.                           External and internal hemorrhoids were found. The                            hemorrhoids were large.                           The exam was otherwise without abnormality on                            direct and retroflexion views. Complications:            No immediate complications. Estimated Blood  Loss:     Estimated blood loss: none. Impression:               - External and internal hemorrhoids. suspect cause                            of + Cologuard                           - The examination was otherwise normal on direct                            and retroflexion views.                           - No specimens collected. Recommendation:           - Patient has a contact number available for                            emergencies. The signs and symptoms of potential                            delayed complications were discussed with the                            patient. Return to normal activities tomorrow.                            Written discharge instructions were provided to the                            patient.                           - Resume previous diet.                           - Continue present medications.                            - No repeat colonoscopy/ other screening due to                            current age (70 years or older) and the absence of                            colonic polyps. Typically stop after age 66. Gatha Mayer, MD 10/08/2019 11:24:03 AM This report has been signed electronically.

## 2019-10-10 ENCOUNTER — Telehealth: Payer: Self-pay

## 2019-10-10 NOTE — Telephone Encounter (Signed)
Second post procedure follow up call, no answer 

## 2019-10-10 NOTE — Telephone Encounter (Signed)
First post procedure follow up call, left message. °

## 2019-10-16 ENCOUNTER — Ambulatory Visit: Payer: BC Managed Care – PPO

## 2019-11-05 ENCOUNTER — Ambulatory Visit: Payer: Self-pay

## 2019-11-05 ENCOUNTER — Other Ambulatory Visit: Payer: Self-pay

## 2019-11-05 ENCOUNTER — Ambulatory Visit: Payer: BC Managed Care – PPO | Admitting: Orthopedic Surgery

## 2019-11-05 DIAGNOSIS — M65332 Trigger finger, left middle finger: Secondary | ICD-10-CM

## 2019-11-05 DIAGNOSIS — M25512 Pain in left shoulder: Secondary | ICD-10-CM | POA: Diagnosis not present

## 2019-11-06 ENCOUNTER — Encounter: Payer: Self-pay | Admitting: Orthopedic Surgery

## 2019-11-06 NOTE — Progress Notes (Signed)
Office Visit Note   Patient: David Lawson           Date of Birth: 01-Sep-1951           MRN: [id number] Visit Date: 11/05/2019 Requested by: Susy Frizzle, MD [id number] [place],  [place] [id number] PCP: Susy Frizzle, MD  Subjective: Chief Complaint  Patient presents with  . Left Shoulder - Pain    HPI: David Lawson is a 69 year old patient with left shoulder pain.  He slipped a week ago and actually had to grab onto a post in order to keep from falling.  Reported some sharp pain in the shoulder.  Has had prior surgery 10 years ago which was biceps tenodesis.  He has had right shoulder rotator cuff surgery which was a large tear which was repairable.  He would prefer to wait until November to do anything with the left shoulder.  He wants just to make sure that nothing is happened to the shoulder that would potentially become unrepairable in the near future.  He also describes left index finger triggering.  He has had 2 injections done 2 years ago and 1 year ago respectively.  He reports continued pain in that left index finger.  He is going to retire in 3 months.  Has a lot of projects around the house that he would like to get done.              ROS: All systems reviewed are negative as they relate to the chief complaint within the history of present illness.  Patient denies  fevers or chills.   Assessment & Plan: Visit Diagnoses:  1. Acute pain of left shoulder   2. Trigger finger, left index finger     Plan: Impression is left shoulder injury with good strength on physical exam today and intact subscap on ultrasound exam.  The infraspinatus and supraspinatus a little bit more difficult to evaluate but based on strength and exam I doubt that he has a large tear of those cuff tendons.  Plan is index finger trigger finger release.  Risk benefits are discussed.  Expected amount of time out of work and rehab also discussed.  Regarding the shoulder I think that something we can watch  and evaluate during his recovery from the trigger finger.  He does not really have much in the way of significant signs or symptoms of major rotator cuff tear at this time.  We will see him 9 days after his left index trigger finger release.  Follow-Up Instructions: No follow-ups on file.   Orders:  Orders Placed This Encounter  Procedures  . XR Shoulder Left   No orders of the defined types were placed in this encounter.     Procedures: No procedures performed   Clinical Data: No additional findings.  Objective: Vital Signs: There were no vitals taken for this visit.  Physical Exam:   Constitutional: Patient appears well-developed HEENT:  Head: Normocephalic Eyes:EOM are normal Neck: Normal range of motion Cardiovascular: Normal rate Pulmonary/chest: Effort normal Neurologic: Patient is alert Skin: Skin is warm Psychiatric: Patient has normal mood and affect    Ortho Exam: Ortho exam demonstrates tenderness at the A1 pulley of that left index finger.  Does have triggering present.  No infection present.  Full range of motion at the MCP PIP and DIP joint.  Left shoulder is examined.  He has full active and passive range of motion of the shoulder with some  pain at max of forward flexion.  No coarse grinding or crepitus with internal X rotation of that left shoulder.  No other masses lymphadenopathy or skin change in the shoulder girdle region.  No AC joint tenderness to direct palpation.  Excellent strength infraspinatus supraspinatus and subscap muscle testing.  Ultrasound exam demonstrates intact subscap.  No Popeye deformity present on exam.  Supraspinatus and infraspinatus appear to be intact but that has little bit more of a difficult examination with our current ultrasound.  Specialty Comments:  No specialty comments available.  Imaging: XR Shoulder Left  Result Date: 11/06/2019 AP axillary outlet left shoulder reviewed.  Acromiohumeral distance maintained.  Shoulder  is located.  No acute fracture.  Visualized lung fields clear.  Minimal degenerative changes in the [facility] joint.    PMFS History: Patient Active Problem List   Diagnosis Date Noted  . Trigger finger, right index finger 10/26/2016  . Acute renal failure ([place])   . Gout   . Nephrolithiasis   . Ureteral obstruction    Past Medical History:  Diagnosis Date  . Blood transfusion without reported diagnosis    as a child  . CKD (chronic kidney disease) stage 3, GFR 30-59 ml/min   . DDD (degenerative disc disease)   . Gout   . Nephrolithiasis   . PMR (polymyalgia rheumatica) ([facility])   . Ureteral obstruction     Family History  Problem Relation Age of Onset  . Colon cancer Neg Hx   . Esophageal cancer Neg Hx   . Rectal cancer Neg Hx   . Stomach cancer Neg Hx     Past Surgical History:  Procedure Laterality Date  . bone spur     foot  . COLONOSCOPY    . HERNIA REPAIR    . KNEE SURGERY Right   . SHOULDER SURGERY Bilateral   . TRIGGER FINGER RELEASE     x4  . URETER SURGERY     x3   Social History   Occupational History  . Not on file  Tobacco Use  . Smoking status: Never Smoker  . Smokeless tobacco: Never Used  Substance and Sexual Activity  . Alcohol use: Not Currently  . Drug use: No  . Sexual activity: Not on file

## 2019-11-21 ENCOUNTER — Ambulatory Visit: Payer: BC Managed Care – PPO | Attending: Internal Medicine

## 2019-11-21 DIAGNOSIS — Z23 Encounter for immunization: Secondary | ICD-10-CM

## 2019-11-21 NOTE — Progress Notes (Signed)
   Covid-19 Vaccination Clinic  Name:  David Lawson    MRN: [id number] DOB: [birth year]  11/21/2019  [name] was observed post Covid-19 immunization for 15 minutes without incident. He was provided with Vaccine Information Sheet and instruction to access the V-Safe system.   [name] was instructed to call 911 with any severe reactions post vaccine: Marland Kitchen Difficulty breathing  . Swelling of face and throat  . A fast heartbeat  . A bad rash all over body  . Dizziness and weakness   Immunizations Administered    Name Date Dose VIS Date Route   Moderna COVID-19 Vaccine 11/21/2019  9:08 AM 0.5 mL 08/05/2019 Intramuscular   Manufacturer: Moderna   Lot: [id number]   [id number]

## 2019-12-01 ENCOUNTER — Encounter: Payer: Self-pay | Admitting: Orthopedic Surgery

## 2019-12-01 ENCOUNTER — Other Ambulatory Visit: Payer: Self-pay | Admitting: Surgical

## 2019-12-01 DIAGNOSIS — M65322 Trigger finger, left index finger: Secondary | ICD-10-CM | POA: Diagnosis not present

## 2019-12-01 MED ORDER — METHOCARBAMOL 500 MG PO TABS: 500.0000 mg | ORAL_TABLET | Freq: Three times a day (TID) | ORAL | 0 refills | Status: DC | PRN

## 2019-12-01 MED ORDER — ASPIRIN EC 81 MG PO TBEC: 81.0000 mg | DELAYED_RELEASE_TABLET | Freq: Every day | ORAL | 0 refills | Status: AC

## 2019-12-01 MED ORDER — OXYCODONE HCL 5 MG PO TABS: 5.0000 mg | ORAL_TABLET | Freq: Four times a day (QID) | ORAL | 0 refills | Status: DC | PRN

## 2019-12-06 ENCOUNTER — Other Ambulatory Visit: Payer: Self-pay | Admitting: Family Medicine

## 2019-12-10 ENCOUNTER — Other Ambulatory Visit: Payer: Self-pay

## 2019-12-10 ENCOUNTER — Encounter: Payer: Self-pay | Admitting: Orthopedic Surgery

## 2019-12-10 ENCOUNTER — Ambulatory Visit (INDEPENDENT_AMBULATORY_CARE_PROVIDER_SITE_OTHER): Payer: BC Managed Care – PPO | Admitting: Orthopedic Surgery

## 2019-12-10 DIAGNOSIS — M65332 Trigger finger, left middle finger: Secondary | ICD-10-CM

## 2019-12-10 NOTE — Progress Notes (Signed)
   Post-Op Visit Note   Patient: David Lawson           Date of Birth: 11-Aug-1951           MRN: [id number] Visit Date: 12/10/2019 PCP: Susy Frizzle, MD   Assessment & Plan:  Chief Complaint:  Chief Complaint  Patient presents with  . Follow-up   Visit Diagnoses:  1. Trigger finger, left index finger     Plan: David Lawson is a patient who is now about 9 days out left index trigger finger release.  He also has some partial tearing of one of his sublimis tendon slips.  On exam the after the PE is intact and he also has flexion at the DIP and MCP and PIP joints.  Range of motion is predictably stiff.  Plan at this time is suture removal with manual range of motion work on the finger.  He also has left elbow olecranon bursa which is aspirated today.  Injected 1 cc of Toradol.  Come back in 4 weeks for clinical recheck.  Follow-Up Instructions: Return in about 4 weeks (around 01/07/2020).   Orders:  No orders of the defined types were placed in this encounter.  No orders of the defined types were placed in this encounter.   Imaging: No results found.  PMFS History: Patient Active Problem List   Diagnosis Date Noted  . Trigger finger, right index finger 10/26/2016  . Acute renal failure ([place])   . Gout   . Nephrolithiasis   . Ureteral obstruction    Past Medical History:  Diagnosis Date  . Blood transfusion without reported diagnosis    as a child  . CKD (chronic kidney disease) stage 3, GFR 30-59 ml/min   . DDD (degenerative disc disease)   . Gout   . Nephrolithiasis   . PMR (polymyalgia rheumatica) ([facility])   . Ureteral obstruction     Family History  Problem Relation Age of Onset  . Colon cancer Neg Hx   . Esophageal cancer Neg Hx   . Rectal cancer Neg Hx   . Stomach cancer Neg Hx     Past Surgical History:  Procedure Laterality Date  . bone spur     foot  . COLONOSCOPY    . HERNIA REPAIR    . KNEE SURGERY Right   . SHOULDER SURGERY Bilateral   . TRIGGER FINGER  RELEASE     x4  . URETER SURGERY     x3   Social History   Occupational History  . Not on file  Tobacco Use  . Smoking status: Never Smoker  . Smokeless tobacco: Never Used  Substance and Sexual Activity  . Alcohol use: Not Currently  . Drug use: No  . Sexual activity: Not on file

## 2019-12-22 ENCOUNTER — Encounter: Payer: Self-pay | Admitting: Family Medicine

## 2019-12-22 ENCOUNTER — Other Ambulatory Visit: Payer: Self-pay

## 2019-12-22 ENCOUNTER — Ambulatory Visit (INDEPENDENT_AMBULATORY_CARE_PROVIDER_SITE_OTHER): Payer: BC Managed Care – PPO | Admitting: Family Medicine

## 2019-12-22 VITALS — BP 110/68 | HR 58 | Temp 96.7°F | Resp 18 | Ht 67.0 in | Wt 179.0 lb

## 2019-12-22 DIAGNOSIS — M7022 Olecranon bursitis, left elbow: Secondary | ICD-10-CM | POA: Diagnosis not present

## 2019-12-22 NOTE — Progress Notes (Signed)
Subjective:    Patient ID: David Lawson, male    DOB: 1951-02-03, 69 y.o.   MRN: [id number]  HPI  Patient has experienced recurrent swelling in the olecranon bursa of his left elbow.  He has a fluid-filled sac directly over the olecranon process.  There is no erythema.  There is no warmth.  There is no pain with range of motion.  He has full and painless range of motion in his left elbow.  He denies any trauma.  He does have a past history of gout however there is no evidence of a gout exacerbation.  I aspirated the fluid in November initially.   Past Medical History:  Diagnosis Date  . Blood transfusion without reported diagnosis    as a child  . CKD (chronic kidney disease) stage 3, GFR 30-59 ml/min   . DDD (degenerative disc disease)   . Gout   . Nephrolithiasis   . PMR (polymyalgia rheumatica) ([facility])   . Ureteral obstruction    Past Surgical History:  Procedure Laterality Date  . bone spur     foot  . COLONOSCOPY    . HERNIA REPAIR    . KNEE SURGERY Right   . SHOULDER SURGERY Bilateral   . TRIGGER FINGER RELEASE     x4  . URETER SURGERY     x3   Current Outpatient Medications on File Prior to Visit  Medication Sig Dispense Refill  . allopurinol (ZYLOPRIM) 100 MG tablet TAKE 2 TABLETS BY MOUTH DAILY GENERIC EQUIVALENT FOR ZYLOPRIM 180 tablet 2  . aspirin EC 81 MG tablet Take 1 tablet (81 mg total) by mouth daily. 14 tablet 0  . Colchicine (MITIGARE) 0.6 MG CAPS Take 0.6 mg by mouth daily. (Patient taking differently: Take 0.6 mg by mouth as needed. ) 30 capsule 3  . glucosamine-chondroitin 500-400 MG tablet Take 1 tablet by mouth 3 (three) times daily.      . methocarbamol (ROBAXIN) 500 MG tablet Take 1 tablet (500 mg total) by mouth every 8 (eight) hours as needed. 30 tablet 0  . Multiple Vitamins-Minerals (MULTIVITAMIN,TX-MINERALS) tablet Take 1 tablet by mouth daily.      . niacin 500 MG tablet Take 500 mg by mouth 2 (two) times daily with a meal.    . oxyCODONE  (ROXICODONE) 5 MG immediate release tablet Take 1 tablet (5 mg total) by mouth every 6 (six) hours as needed. 20 tablet 0   No current facility-administered medications on file prior to visit.   Allergies  Allergen Reactions  . Sulfa Antibiotics     Unsure of reaction- as a child   Social History   Socioeconomic History  . Marital status: Married    Spouse name: Not on file  . Number of children: Not on file  . Years of education: Not on file  . Highest education level: Not on file  Occupational History  . Not on file  Tobacco Use  . Smoking status: Never Smoker  . Smokeless tobacco: Never Used  Substance and Sexual Activity  . Alcohol use: Not Currently  . Drug use: No  . Sexual activity: Not on file  Other Topics Concern  . Not on file  Social History Narrative  . Not on file   Social Determinants of Health   Financial Resource Strain:   . Difficulty of Paying Living Expenses:   Food Insecurity:   . Worried About Charity fundraiser in the Last Year:   . [employer]  of Food in the Last Year:   Transportation Needs:   . Film/video editor (Medical):   Marland Kitchen Lack of Transportation (Non-Medical):   Physical Activity:   . Days of Exercise per Week:   . Minutes of Exercise per Session:   Stress:   . Feeling of Stress :   Social Connections:   . Frequency of Communication with Friends and Family:   . Frequency of Social Gatherings with Friends and Family:   . Attends Religious Services:   . Active Member of Clubs or Organizations:   . Attends Archivist Meetings:   Marland Kitchen Marital Status:   Intimate Partner Violence:   . Fear of Current or Ex-Partner:   . Emotionally Abused:   Marland Kitchen Physically Abused:   . Sexually Abused:      Review of Systems  All other systems reviewed and are negative.      Objective:   Physical Exam Vitals reviewed.  Constitutional:      Appearance: Normal appearance.  Cardiovascular:     Rate and Rhythm: Normal rate and regular  rhythm.     Heart sounds: Normal heart sounds.  Pulmonary:     Effort: Pulmonary effort is normal.     Breath sounds: Normal breath sounds.  Musculoskeletal:     Left elbow: Swelling and deformity present. No effusion. Normal range of motion. No tenderness. No radial head, medial epicondyle or lateral epicondyle tenderness.  Neurological:     Mental Status: He is alert.           Assessment & Plan:  No diagnosis found. Skin was prepped in sterile fashion with Betadine.  The skin was anesthetized over the olecranon process was 0.1% lidocaine with epinephrine.  An 18-gauge needle was then inserted into the olecranon bursa and with aspiration, 10 cc of bloody serous fluid was aspirated from the olecranon bursa.  Through the same 18 gauge needle 1 cc of 40 mg/mL Kenalog was injected into the olecranon bursa using sterile technique.  A pressure dressing was applied.  Recommended daily dressing changes to keep pressure on the affected area for the least the next 3 to 4 days to prevent reaccumulation.  Reassess if no better in 1 week or sooner if worsening.  Advised the patient to monitor for any infection.

## 2019-12-24 ENCOUNTER — Ambulatory Visit: Payer: BC Managed Care – PPO | Attending: Internal Medicine

## 2019-12-24 DIAGNOSIS — Z23 Encounter for immunization: Secondary | ICD-10-CM

## 2019-12-24 NOTE — Progress Notes (Signed)
   Covid-19 Vaccination Clinic  Name:  LYRIC WEHRER    MRN: [id number] DOB: Sep 06, 1950  12/24/2019  Mr. Heatwole was observed post Covid-19 immunization for 15 minutes without incident. He was provided with Vaccine Information Sheet and instruction to access the V-Safe system.   Mr. Borghese was instructed to call 911 with any severe reactions post vaccine: Marland Kitchen Difficulty breathing  . Swelling of face and throat  . A fast heartbeat  . A bad rash all over body  . Dizziness and weakness   Immunizations Administered    Name Date Dose VIS Date Route   Moderna COVID-19 Vaccine 12/24/2019  8:57 AM 0.5 mL 08/2019 Intramuscular   Manufacturer: Moderna   Lot: [id number]   [place][id number]

## 2020-01-07 ENCOUNTER — Other Ambulatory Visit: Payer: Self-pay

## 2020-01-07 ENCOUNTER — Ambulatory Visit (INDEPENDENT_AMBULATORY_CARE_PROVIDER_SITE_OTHER): Payer: BC Managed Care – PPO | Admitting: Orthopedic Surgery

## 2020-01-07 ENCOUNTER — Encounter: Payer: Self-pay | Admitting: Orthopedic Surgery

## 2020-01-07 DIAGNOSIS — M65332 Trigger finger, left middle finger: Secondary | ICD-10-CM

## 2020-01-07 NOTE — Progress Notes (Signed)
   Post-Op Visit Note   Patient: David Lawson           Date of Birth: May 25, 1951           MRN: [id number] Visit Date: 01/07/2020 PCP: Susy Frizzle, MD   Assessment & Plan:  Chief Complaint:  Chief Complaint  Patient presents with  . Follow-up   Visit Diagnoses:  1. Trigger finger, left index finger     Plan: [name] is a patient who is now 4 weeks out left index trigger finger release.  Had partial tearing of 1 slip of his sublimis tendon.  Also had elbow bursa injected and that is doing well.  Left index fingers regained good range of motion.  Still has some swelling around the incision but no evidence of infection.  Soft tissue massage encouraged around the incision.  Follow-up as needed.  Follow-Up Instructions: No follow-ups on file.   Orders:  No orders of the defined types were placed in this encounter.  No orders of the defined types were placed in this encounter.   Imaging: No results found.  PMFS History: Patient Active Problem List   Diagnosis Date Noted  . Trigger finger, right index finger 10/26/2016  . Acute renal failure ([place])   . Gout   . Nephrolithiasis   . Ureteral obstruction    Past Medical History:  Diagnosis Date  . Blood transfusion without reported diagnosis    as a child  . CKD (chronic kidney disease) stage 3, GFR 30-59 ml/min   . DDD (degenerative disc disease)   . Gout   . Nephrolithiasis   . PMR (polymyalgia rheumatica) ([facility])   . Ureteral obstruction     Family History  Problem Relation Age of Onset  . Colon cancer Neg Hx   . Esophageal cancer Neg Hx   . Rectal cancer Neg Hx   . Stomach cancer Neg Hx     Past Surgical History:  Procedure Laterality Date  . bone spur     foot  . COLONOSCOPY    . HERNIA REPAIR    . KNEE SURGERY Right   . SHOULDER SURGERY Bilateral   . TRIGGER FINGER RELEASE     x4  . URETER SURGERY     x3   Social History   Occupational History  . Not on file  Tobacco Use  . Smoking status:  Never Smoker  . Smokeless tobacco: Never Used  Substance and Sexual Activity  . Alcohol use: Not Currently  . Drug use: No  . Sexual activity: Not on file

## 2020-04-25 DIAGNOSIS — S61211A Laceration without foreign body of left index finger without damage to nail, initial encounter: Secondary | ICD-10-CM | POA: Diagnosis not present

## 2020-05-05 ENCOUNTER — Ambulatory Visit (INDEPENDENT_AMBULATORY_CARE_PROVIDER_SITE_OTHER): Payer: Medicare Other | Admitting: Nurse Practitioner

## 2020-05-05 ENCOUNTER — Other Ambulatory Visit: Payer: Self-pay

## 2020-05-05 VITALS — BP 100/68 | HR 55 | Temp 98.1°F | Resp 18 | Wt 175.6 lb

## 2020-05-05 DIAGNOSIS — S61210A Laceration without foreign body of right index finger without damage to nail, initial encounter: Secondary | ICD-10-CM | POA: Diagnosis not present

## 2020-05-05 DIAGNOSIS — Z4802 Encounter for removal of sutures: Secondary | ICD-10-CM

## 2020-05-06 NOTE — Progress Notes (Signed)
Established Patient Office Visit  Subjective:  Patient ID: David Lawson, male    DOB: 08-30-1951  Age: 69 y.o. MRN: [id number]  CC:  Chief Complaint  Patient presents with  . Suture / Staple Removal    HPI David Lawson is a 69 year old male presenting to the clinic for removal of 11 sutures placed at urgent care at [facility], [facility] to his right index finger after accidentally cutting with a knife. The site is with out pain, redness, red streaking from the site of laceration, odor, or drainage.   Past Medical History:  Diagnosis Date  . Blood transfusion without reported diagnosis    as a child  . CKD (chronic kidney disease) stage 3, GFR 30-59 ml/min   . DDD (degenerative disc disease)   . Gout   . Nephrolithiasis   . PMR (polymyalgia rheumatica) (HCC)   . Ureteral obstruction     Past Surgical History:  Procedure Laterality Date  . bone spur     foot  . COLONOSCOPY    . HERNIA REPAIR    . KNEE SURGERY Right   . SHOULDER SURGERY Bilateral   . TRIGGER FINGER RELEASE     x4  . URETER SURGERY     x3    Family History  Problem Relation Age of Onset  . Colon cancer Neg Hx   . Esophageal cancer Neg Hx   . Rectal cancer Neg Hx   . Stomach cancer Neg Hx     Social History   Socioeconomic History  . Marital status: Married    Spouse name: Not on file  . Number of children: Not on file  . Years of education: Not on file  . Highest education level: Not on file  Occupational History  . Not on file  Tobacco Use  . Smoking status: Never Smoker  . Smokeless tobacco: Never Used  Vaping Use  . Vaping Use: Never used  Substance and Sexual Activity  . Alcohol use: Not Currently  . Drug use: No  . Sexual activity: Not on file  Other Topics Concern  . Not on file  Social History Narrative  . Not on file   Social Determinants of Health   Financial Resource Strain:   . Difficulty of Paying Living Expenses: Not on file  Food Insecurity:   .  Worried About Charity fundraiser in the Last Year: Not on file  . Ran Out of Food in the Last Year: Not on file  Transportation Needs:   . Lack of Transportation (Medical): Not on file  . Lack of Transportation (Non-Medical): Not on file  Physical Activity:   . Days of Exercise per Week: Not on file  . Minutes of Exercise per Session: Not on file  Stress:   . Feeling of Stress : Not on file  Social Connections:   . Frequency of Communication with Friends and Family: Not on file  . Frequency of Social Gatherings with Friends and Family: Not on file  . Attends Religious Services: Not on file  . Active Member of Clubs or Organizations: Not on file  . Attends Archivist Meetings: Not on file  . Marital Status: Not on file  Intimate Partner Violence:   . Fear of Current or Ex-Partner: Not on file  . Emotionally Abused: Not on file  . Physically Abused: Not on file  . Sexually Abused: Not on file    Outpatient Medications Prior to  Visit  Medication Sig Dispense Refill  . allopurinol (ZYLOPRIM) 100 MG tablet TAKE 2 TABLETS BY MOUTH DAILY GENERIC EQUIVALENT FOR ZYLOPRIM 180 tablet 2  . aspirin EC 81 MG tablet Take 1 tablet (81 mg total) by mouth daily. 14 tablet 0  . Colchicine (MITIGARE) 0.6 MG CAPS Take 0.6 mg by mouth daily. (Patient taking differently: Take 0.6 mg by mouth as needed. ) 30 capsule 3  . glucosamine-chondroitin 500-400 MG tablet Take 1 tablet by mouth 3 (three) times daily.      . Multiple Vitamins-Minerals (MULTIVITAMIN,TX-MINERALS) tablet Take 1 tablet by mouth daily.      . niacin 500 MG tablet Take 500 mg by mouth 2 (two) times daily with a meal.    . methocarbamol (ROBAXIN) 500 MG tablet Take 1 tablet (500 mg total) by mouth every 8 (eight) hours as needed. 30 tablet 0  . oxyCODONE (ROXICODONE) 5 MG immediate release tablet Take 1 tablet (5 mg total) by mouth every 6 (six) hours as needed. 20 tablet 0   No facility-administered medications prior to visit.      Allergies  Allergen Reactions  . Sulfa Antibiotics     Unsure of reaction- as a child    ROS Review of Systems  All other systems reviewed and are negative.     Objective:    Physical Exam Vitals and nursing note reviewed.  Constitutional:      Appearance: Normal appearance.  Eyes:     Extraocular Movements: Extraocular movements intact.     Conjunctiva/sclera: Conjunctivae normal.     Pupils: Pupils are equal, round, and reactive to light.  Cardiovascular:     Rate and Rhythm: Normal rate.  Pulmonary:     Effort: Pulmonary effort is normal.  Musculoskeletal:        General: Normal range of motion.     Cervical back: Normal range of motion.  Neurological:     General: No focal deficit present.     Mental Status: He is oriented to person, place, and time.  Psychiatric:        Mood and Affect: Mood normal.        Thought Content: Thought content normal.        Judgment: Judgment normal.     BP 100/68 (BP Location: Left Arm, Patient Position: Sitting, Cuff Size: Normal)   Pulse (!) 55   Temp 98.1 F (36.7 C) (Temporal)   Resp 18   Wt 175 lb 9.6 oz (79.7 kg)   SpO2 98%   BMI 27.50 kg/m  Wt Readings from Last 3 Encounters:  05/05/20 175 lb 9.6 oz (79.7 kg)  12/22/19 179 lb (81.2 kg)  10/08/19 180 lb (81.6 kg)     Health Maintenance Due  Topic Date Due  . INFLUENZA VACCINE  04/04/2020    There are no preventive care reminders to display for this patient.  No results found for: TSH Lab Results  Component Value Date   WBC 4.0 05/09/2019   HGB 13.9 05/09/2019   HCT 41.0 05/09/2019   MCV 95.6 05/09/2019   PLT 169 05/09/2019   Lab Results  Component Value Date   NA 141 05/09/2019   K 5.4 (H) 05/09/2019   CO2 23 05/09/2019   GLUCOSE 94 05/09/2019   BUN 32 (H) 05/09/2019   CREATININE 2.07 (H) 05/09/2019   BILITOT 0.5 05/09/2019   ALKPHOS 80 05/06/2015   AST 29 05/09/2019   ALT 21 05/09/2019   PROT 6.1 05/09/2019  ALBUMIN 4.1 05/06/2015    CALCIUM 9.3 05/09/2019   Lab Results  Component Value Date   CHOL 158 05/09/2019   Lab Results  Component Value Date   HDL 43 05/09/2019   Lab Results  Component Value Date   LDLCALC 102 (H) 05/09/2019   Lab Results  Component Value Date   TRIG 43 05/09/2019   Lab Results  Component Value Date   CHOLHDL 3.7 05/09/2019   No results found for: HGBA1C    Assessment & Plan:   Problem List Items Addressed This Visit    None    Visit Diagnoses    Encounter for removal of sutures    -  Primary     11 sutures removed successfully, pt tolerated well, suture line closed and healing well approximated edges.  No orders of the defined types were placed in this encounter.   Follow-up: Return if symptoms worsen or fail to improve.    Annie Main, FNP

## 2020-07-22 ENCOUNTER — Ambulatory Visit: Payer: Medicare Other | Admitting: Orthopedic Surgery

## 2020-07-22 DIAGNOSIS — M65319 Trigger thumb, unspecified thumb: Secondary | ICD-10-CM | POA: Diagnosis not present

## 2020-07-24 ENCOUNTER — Encounter: Payer: Self-pay | Admitting: Orthopedic Surgery

## 2020-07-24 DIAGNOSIS — M65319 Trigger thumb, unspecified thumb: Secondary | ICD-10-CM

## 2020-07-24 MED ORDER — LIDOCAINE HCL 1 % IJ SOLN
3.0000 mL | INTRAMUSCULAR | Status: AC | PRN
  Administered 2020-07-24: 3 mL

## 2020-07-24 MED ORDER — METHYLPREDNISOLONE ACETATE 40 MG/ML IJ SUSP
13.3300 mg | INTRAMUSCULAR | Status: AC | PRN
  Administered 2020-07-24: 13.33 mg

## 2020-07-24 MED ORDER — BUPIVACAINE HCL 0.25 % IJ SOLN
0.3300 mL | INTRAMUSCULAR | Status: AC | PRN
  Administered 2020-07-24: .33 mL

## 2020-07-24 NOTE — Progress Notes (Signed)
Office Visit Note   Patient: David Lawson           Date of Birth: Feb 08, 1951           MRN: [id number] Visit Date: 07/22/2020 Requested by: Susy Frizzle, MD [address] [place],  [place] [address] PCP: Susy Frizzle, MD  Subjective: Chief Complaint  Patient presents with  . trigger thumb    HPI: David Lawson is a 69 year old patient with left thumb triggering for the past 2 to 3 months.  Denies a history of injury or trauma.  Does interfere with his ability to work.  He has had multiple other trigger digits some of which have been relieved with injections others have gone on to require surgery.  His left shoulder is doing reasonably well and he wishes to proceed with nonoperative treatment at this time.              ROS: All systems reviewed are negative as they relate to the chief complaint within the history of present illness.  Patient denies  fevers or chills.   Assessment & Plan: Visit Diagnoses:  1. Stenosing tenosynovitis of thumb     Plan: Impression is left trigger thumb.  Plan is ultrasound-guided injection today.  This would be his 1st injection into the left thumb A1 pulley region.  If symptoms recur we could consider another injection or based on his history we would likely proceed with release.  Follow-up as needed.  Follow-Up Instructions: Return if symptoms worsen or fail to improve.   Orders:  No orders of the defined types were placed in this encounter.  No orders of the defined types were placed in this encounter.     Procedures: Hand/UE Inj: L thumb A1 for trigger finger on 07/24/2020 7:34 AM Indications: therapeutic Details: 25 G needle, ultrasound-guided volar approach Medications: 0.33 mL bupivacaine 0.25 %; 13.33 mg methylPREDNISolone acetate 40 MG/ML; 3 mL lidocaine 1 % Outcome: tolerated well, no immediate complications Procedure, treatment alternatives, risks and benefits explained, specific risks discussed. Consent was given by the  patient. Immediately prior to procedure a time out was called to verify the correct patient, procedure, equipment, support staff and site/side marked as required. Patient was prepped and draped in the usual sterile fashion.       Clinical Data: No additional findings.  Objective: Vital Signs: There were no vitals taken for this visit.  Physical Exam:   Constitutional: Patient appears well-developed HEENT:  Head: Normocephalic Eyes:EOM are normal Neck: Normal range of motion Cardiovascular: Normal rate Pulmonary/chest: Effort normal Neurologic: Patient is alert Skin: Skin is warm Psychiatric: Patient has normal mood and affect    Ortho Exam: Ortho exam demonstrates constant triggering of the left thumb with flexion.  Tenderness over the A1 pulley.  EPL FPL function intact on the left-hand side.  Wrist range of motion intact.  Grip strength otherwise intact.  Specialty Comments:  No specialty comments available.  Imaging: No results found.   PMFS History: Patient Active Problem List   Diagnosis Date Noted  . Trigger finger, right index finger 10/26/2016  . Acute renal failure ([place])   . Gout   . Nephrolithiasis   . Ureteral obstruction    Past Medical History:  Diagnosis Date  . Blood transfusion without reported diagnosis    as a child  . CKD (chronic kidney disease) stage 3, GFR 30-59 ml/min (HCC)   . DDD (degenerative disc disease)   . Gout   .  Nephrolithiasis   . PMR (polymyalgia rheumatica) (HCC)   . Ureteral obstruction     Family History  Problem Relation Age of Onset  . Colon cancer Neg Hx   . Esophageal cancer Neg Hx   . Rectal cancer Neg Hx   . Stomach cancer Neg Hx     Past Surgical History:  Procedure Laterality Date  . bone spur     foot  . COLONOSCOPY    . HERNIA REPAIR    . KNEE SURGERY Right   . SHOULDER SURGERY Bilateral   . TRIGGER FINGER RELEASE     x4  . URETER SURGERY     x3   Social History   Occupational History  . Not  on file  Tobacco Use  . Smoking status: Never Smoker  . Smokeless tobacco: Never Used  Vaping Use  . Vaping Use: Never used  Substance and Sexual Activity  . Alcohol use: Not Currently  . Drug use: No  . Sexual activity: Not on file

## 2020-09-17 ENCOUNTER — Other Ambulatory Visit: Payer: Self-pay | Admitting: Family Medicine

## 2020-10-06 ENCOUNTER — Ambulatory Visit: Payer: Self-pay

## 2020-10-06 ENCOUNTER — Other Ambulatory Visit: Payer: Self-pay

## 2020-10-06 ENCOUNTER — Ambulatory Visit (INDEPENDENT_AMBULATORY_CARE_PROVIDER_SITE_OTHER): Payer: Medicare Other | Admitting: Orthopedic Surgery

## 2020-10-06 DIAGNOSIS — M25512 Pain in left shoulder: Secondary | ICD-10-CM

## 2020-10-08 ENCOUNTER — Encounter: Payer: Self-pay | Admitting: Orthopedic Surgery

## 2020-10-08 NOTE — Progress Notes (Signed)
Office Visit Note   Patient: David Lawson           Date of Birth: [birth year]           MRN: [id number] Visit Date: 10/06/2020 Requested by: Susy Frizzle, MD [address] [place],  [place] [address] PCP: Susy Frizzle, MD  Subjective: Chief Complaint  Patient presents with  . Left Shoulder - Pain    HPI: David Lawson is a patient with left shoulder pain.  He has had left shoulder pain for several years.  Trimming [name] trees this week and he felt a sharp pain in the shoulder.  He has had prior left shoulder arthroscopic evaluation as well as biceps tenodesis.  Extension is painful for him.  He is out of town next week.  He has had several episodes where he has had acute pain in the shoulder which has subsided.  He has also had injection in the shoulder in the past.  He has a lot of projects coming up in the spring.              ROS: All systems reviewed are negative as they relate to the chief complaint within the history of present illness.  Patient denies  fevers or chills.   Assessment & Plan: Visit Diagnoses:  1. Left shoulder pain, unspecified chronicity     Plan: Impression is left shoulder pain with possible rotator cuff tear.  He has had this long enough that MRI scan is indicated to evaluate the extent of possible rotator cuff pathology.  He is active enough that he would consider surgical intervention if addressable pathology is determined.  We will see him back after that study.  Follow-Up Instructions: Return for after MRI.   Orders:  Orders Placed This Encounter  Procedures  . XR Shoulder Left  . MR Shoulder Left w/ contrast  . Arthrogram   No orders of the defined types were placed in this encounter.     Procedures: No procedures performed   Clinical Data: No additional findings.  Objective: Vital Signs: There were no vitals taken for this visit.  Physical Exam:   Constitutional: Patient appears well-developed HEENT:  Head:  Normocephalic Eyes:EOM are normal Neck: Normal range of motion Cardiovascular: Normal rate Pulmonary/chest: Effort normal Neurologic: Patient is alert Skin: Skin is warm Psychiatric: Patient has normal mood and affect    Ortho Exam: Ortho exam demonstrates slight weakness to supraspinatus testing on the left compared to the right.  He has a little bit of coarse grinding with internal ex rotation at 90 degrees abduction.  Radial pulses intact.  No restriction of external rotation 15 degrees of abduction.  No other masses lymphadenopathy or skin changes noted in that left shoulder girdle region.  Specialty Comments:  No specialty comments available.  Imaging: No results found.   PMFS History: Patient Active Problem List   Diagnosis Date Noted  . Trigger finger, right index finger 10/26/2016  . Acute renal failure ([place])   . Gout   . Nephrolithiasis   . Ureteral obstruction    Past Medical History:  Diagnosis Date  . Blood transfusion without reported diagnosis    as a child  . CKD (chronic Lawson disease) stage 3, GFR 30-59 ml/min (HCC)   . DDD (degenerative disc disease)   . Gout   . Nephrolithiasis   . PMR (polymyalgia rheumatica) (HCC)   . Ureteral obstruction     Family History  Problem Relation Age  of Onset  . Colon cancer Neg Hx   . Esophageal cancer Neg Hx   . Rectal cancer Neg Hx   . Stomach cancer Neg Hx     Past Surgical History:  Procedure Laterality Date  . bone spur     foot  . COLONOSCOPY    . HERNIA REPAIR    . KNEE SURGERY Right   . SHOULDER SURGERY Bilateral   . TRIGGER FINGER RELEASE     x4  . URETER SURGERY     x3   Social History   Occupational History  . Not on file  Tobacco Use  . Smoking status: Never Smoker  . Smokeless tobacco: Never Used  Vaping Use  . Vaping Use: Never used  Substance and Sexual Activity  . Alcohol use: Not Currently  . Drug use: No  . Sexual activity: Not on file

## 2020-11-01 ENCOUNTER — Ambulatory Visit
Admission: RE | Admit: 2020-11-01 | Discharge: 2020-11-01 | Disposition: A | Discharge status: Home / Self Care | Payer: Medicare Other | Source: Ambulatory Visit | Attending: Orthopedic Surgery | Admitting: Orthopedic Surgery

## 2020-11-01 ENCOUNTER — Other Ambulatory Visit: Payer: Self-pay

## 2020-11-01 DIAGNOSIS — M25512 Pain in left shoulder: Secondary | ICD-10-CM

## 2020-11-01 DIAGNOSIS — M75112 Incomplete rotator cuff tear or rupture of left shoulder, not specified as traumatic: Secondary | ICD-10-CM | POA: Diagnosis not present

## 2020-11-01 MED ORDER — IOPAMIDOL (ISOVUE-M 200) INJECTION 41%
15.0000 mL | Freq: Once | INTRAMUSCULAR | Status: AC
  Administered 2020-11-01: 15 mL via INTRA_ARTICULAR

## 2020-12-01 ENCOUNTER — Ambulatory Visit: Payer: Medicare Other | Admitting: Orthopedic Surgery

## 2020-12-01 ENCOUNTER — Other Ambulatory Visit: Payer: Self-pay

## 2020-12-01 DIAGNOSIS — Z9889 Other specified postprocedural states: Secondary | ICD-10-CM

## 2020-12-01 DIAGNOSIS — M25512 Pain in left shoulder: Secondary | ICD-10-CM | POA: Diagnosis not present

## 2020-12-01 DIAGNOSIS — G8929 Other chronic pain: Secondary | ICD-10-CM

## 2020-12-05 ENCOUNTER — Encounter: Payer: Self-pay | Admitting: Orthopedic Surgery

## 2020-12-05 NOTE — Progress Notes (Signed)
Office Visit Note   Patient: David Lawson           Date of Birth: [birth year]           MRN: [id number] Visit Date: 12/01/2020 Requested by: Susy Frizzle, MD [address] [place],  [place] [address] PCP: Susy Frizzle, MD  Subjective: Chief Complaint  Patient presents with  . Other     Scan review    HPI: David Lawson is a 70 y.o. male who presents to the office complaining of left shoulder pain.  Patient is here to review MRI of the left shoulder.  He complains of intermittent left shoulder pain but nothing that wakes him up from sleep at night.  Shoulder pain is not even the most painful problem he has with his hip and bilateral thumbs causing more pain.  He has increased pain with lifting out away from his body but overall he has well-preserved function and is able to function throughout his daily life.  He does complain of left trigger thumb that causes him mostly pain at night and in the morning.  He has had 1 injection in the past and does not want to try another injection at this time due to not having any pain during the day..                ROS: All systems reviewed are negative as they relate to the chief complaint within the history of present illness.  Patient denies fevers or chills.  Assessment & Plan: Visit Diagnoses:  1. History of repair of left rotator cuff   2. Chronic left shoulder pain     Plan: Patient is a 70 year old male who presents to review MRI of the left shoulder.  MRI revealed undersurface tear of the supraspinatus and infraspinatus with laminar retraction to the top of the humeral head of the infraspinatus tear.  No muscle atrophy.  Patient has stable somewhat tenuous insertion of the supraspinatus and infraspinatus which is still functional for him.  He has a history of rotator cuff repair in the past.  He has on and off pain that is worse with lifting heavy but does not cause him to really alter his daily activities.  Not waking up with  pain at night.  This is not bothering him enough for surgery at this time so recommended he modify his daily activities as he can to avoid lifting heavy and control his pain.  He will return if pain gets to the point where it causing him significant difficulties or if he begins to lose function and has difficulty lifting his arm.  Patient agreed with this plan.  Follow-up as needed.  Follow-Up Instructions: No follow-ups on file.   Orders:  No orders of the defined types were placed in this encounter.  No orders of the defined types were placed in this encounter.     Procedures: No procedures performed   Clinical Data: No additional findings.  Objective: Vital Signs: There were no vitals taken for this visit.  Physical Exam:  Constitutional: Patient appears well-developed HEENT:  Head: Normocephalic Eyes:EOM are normal Neck: Normal range of motion Cardiovascular: Normal rate Pulmonary/chest: Effort normal Neurologic: Patient is alert Skin: Skin is warm Psychiatric: Patient has normal mood and affect  Ortho Exam: Ortho exam demonstrates left shoulder with 40 degrees external rotation, 110 degrees abduction, 150 degrees forward flexion passively.  Active range of motion equals passive range of motion.  He  has some crepitus with passive motion of the shoulder anterior and laterally.  Overall his rotator cuff strength is pretty good rated at 5/5 for supraspinatus, infraspinatus, subscapularis.  Negative Hornblower sign.  Negative drop arm test.  Specialty Comments:  No specialty comments available.  Imaging: No results found.   PMFS History: Patient Active Problem List   Diagnosis Date Noted  . Trigger finger, right index finger 10/26/2016  . Acute renal failure ([place])   . Gout   . Nephrolithiasis   . Ureteral obstruction    Past Medical History:  Diagnosis Date  . Blood transfusion without reported diagnosis    as a child  . CKD (chronic kidney disease) stage 3, GFR  30-59 ml/min (HCC)   . DDD (degenerative disc disease)   . Gout   . Nephrolithiasis   . PMR (polymyalgia rheumatica) (HCC)   . Ureteral obstruction     Family History  Problem Relation Age of Onset  . Colon cancer Neg Hx   . Esophageal cancer Neg Hx   . Rectal cancer Neg Hx   . Stomach cancer Neg Hx     Past Surgical History:  Procedure Laterality Date  . bone spur     foot  . COLONOSCOPY    . HERNIA REPAIR    . KNEE SURGERY Right   . SHOULDER SURGERY Bilateral   . TRIGGER FINGER RELEASE     x4  . URETER SURGERY     x3   Social History   Occupational History  . Not on file  Tobacco Use  . Smoking status: Never Smoker  . Smokeless tobacco: Never Used  Vaping Use  . Vaping Use: Never used  Substance and Sexual Activity  . Alcohol use: Not Currently  . Drug use: No  . Sexual activity: Not on file

## 2021-03-18 ENCOUNTER — Other Ambulatory Visit: Payer: Medicare Other

## 2021-03-18 ENCOUNTER — Other Ambulatory Visit: Payer: Self-pay

## 2021-03-18 DIAGNOSIS — N1832 Chronic kidney disease, stage 3b: Secondary | ICD-10-CM | POA: Diagnosis not present

## 2021-03-18 DIAGNOSIS — Z13220 Encounter for screening for lipoid disorders: Secondary | ICD-10-CM | POA: Diagnosis not present

## 2021-03-18 DIAGNOSIS — Z0000 Encounter for general adult medical examination without abnormal findings: Secondary | ICD-10-CM

## 2021-03-18 DIAGNOSIS — Z136 Encounter for screening for cardiovascular disorders: Secondary | ICD-10-CM

## 2021-03-18 DIAGNOSIS — Z125 Encounter for screening for malignant neoplasm of prostate: Secondary | ICD-10-CM

## 2021-03-19 LAB — COMPLETE METABOLIC PANEL WITH GFR
AG Ratio: 2.2 (calc) (ref 1.0–2.5)
ALT: 21 U/L (ref 9–46)
AST: 23 U/L (ref 10–35)
Albumin: 4.2 g/dL (ref 3.6–5.1)
Alkaline phosphatase (APISO): 74 U/L (ref 35–144)
BUN/Creatinine Ratio: 16 (calc) (ref 6–22)
BUN: 40 mg/dL — ABNORMAL HIGH (ref 7–25)
CO2: 25 mmol/L (ref 20–32)
Calcium: 9.2 mg/dL (ref 8.6–10.3)
Chloride: 107 mmol/L (ref 98–110)
Creat: 2.46 mg/dL — ABNORMAL HIGH (ref 0.70–1.35)
Globulin: 1.9 g/dL (calc) (ref 1.9–3.7)
Glucose, Bld: 86 mg/dL (ref 65–99)
Potassium: 5.5 mmol/L — ABNORMAL HIGH (ref 3.5–5.3)
Sodium: 140 mmol/L (ref 135–146)
Total Bilirubin: 0.5 mg/dL (ref 0.2–1.2)
Total Protein: 6.1 g/dL (ref 6.1–8.1)
eGFR: 28 mL/min/{1.73_m2} — ABNORMAL LOW (ref >=60)

## 2021-03-19 LAB — LIPID PANEL
Cholesterol: 162 mg/dL (ref <200)
HDL: 41 mg/dL (ref >=40)
LDL Cholesterol (Calc): 106 mg/dL (calc) — ABNORMAL HIGH
Non-HDL Cholesterol (Calc): 121 mg/dL (calc) (ref <130)
Total CHOL/HDL Ratio: 4 (calc) (ref <5.0)
Triglycerides: 67 mg/dL (ref <150)

## 2021-03-19 LAB — CBC WITH DIFFERENTIAL/PLATELET
Absolute Monocytes: 432 cells/uL (ref 200–950)
Basophils Absolute: 18 cells/uL (ref 0–200)
Basophils Relative: 0.4 %
Eosinophils Absolute: 101 cells/uL (ref 15–500)
Eosinophils Relative: 2.2 %
HCT: 41.1 % (ref 38.5–50.0)
Hemoglobin: 13.5 g/dL (ref 13.2–17.1)
Lymphs Abs: 1573 cells/uL (ref 850–3900)
MCH: 32.1 pg (ref 27.0–33.0)
MCHC: 32.8 g/dL (ref 32.0–36.0)
MCV: 97.6 fL (ref 80.0–100.0)
MPV: 10.7 fL (ref 7.5–12.5)
Monocytes Relative: 9.4 %
Neutro Abs: 2475 cells/uL (ref 1500–7800)
Neutrophils Relative %: 53.8 %
Platelets: 167 10*3/uL (ref 140–400)
RBC: 4.21 10*6/uL (ref 4.20–5.80)
RDW: 12.9 % (ref 11.0–15.0)
Total Lymphocyte: 34.2 %
WBC: 4.6 10*3/uL (ref 3.8–10.8)

## 2021-03-19 LAB — PSA: PSA: 2.04 ng/mL (ref <=4.00)

## 2021-03-21 ENCOUNTER — Ambulatory Visit (INDEPENDENT_AMBULATORY_CARE_PROVIDER_SITE_OTHER): Payer: Medicare Other | Admitting: Family Medicine

## 2021-03-21 ENCOUNTER — Other Ambulatory Visit: Payer: Self-pay

## 2021-03-21 ENCOUNTER — Encounter: Payer: Self-pay | Admitting: Family Medicine

## 2021-03-21 VITALS — BP 110/62 | HR 68 | Temp 98.3°F | Resp 14 | Ht 67.0 in | Wt 174.0 lb

## 2021-03-21 DIAGNOSIS — N1832 Chronic kidney disease, stage 3b: Secondary | ICD-10-CM

## 2021-03-21 DIAGNOSIS — Z23 Encounter for immunization: Secondary | ICD-10-CM

## 2021-03-21 DIAGNOSIS — Z0001 Encounter for general adult medical examination with abnormal findings: Secondary | ICD-10-CM

## 2021-03-21 DIAGNOSIS — Z0000 Encounter for general adult medical examination without abnormal findings: Secondary | ICD-10-CM

## 2021-03-21 NOTE — Progress Notes (Signed)
Subjective:    Patient ID: David Lawson, male    DOB: Jul 29, 1951, 70 y.o.   MRN: [id number]  HPI Patient is a very pleasant 70 year old white male who comes in today for complete physical exam. He had a history of ureteral obstruction as a child that required surgery. Due to kidney damage he suffered as a child, he developed CKD as an adult.  This also caused  gout as an adult. His baseline creatinine is around 2.  Last colonoscopy was 2021 and is up to date.  Due for prevnar 20, Shingrix.  Has had 3 COVID vaccinations. Immunization History  Administered Date(s) Administered   Fluad Quad(high Dose 65+) 05/09/2019   Influenza, High Dose Seasonal PF 07/06/2017, 06/21/2018   Influenza-Unspecified 08/03/2016, 07/04/2017   Moderna Sars-Covid-2 Vaccination 11/21/2019, 12/24/2019, 07/22/2020   PNEUMOCOCCAL CONJUGATE-20 03/21/2021   Pneumococcal Polysaccharide-23 06/09/2019   Tdap 03/12/2018, 04/03/2019   Most recent labs are listed below: Lab on 03/18/2021  Component Date Value Ref Range Status   WBC 03/18/2021 4.6  3.8 - 10.8 Thousand/uL Final   RBC 03/18/2021 4.21  4.20 - 5.80 Million/uL Final   Hemoglobin 03/18/2021 13.5  13.2 - 17.1 g/dL Final   HCT 03/18/2021 41.1  38.5 - 50.0 % Final   MCV 03/18/2021 97.6  80.0 - 100.0 fL Final   MCH 03/18/2021 32.1  27.0 - 33.0 pg Final   MCHC 03/18/2021 32.8  32.0 - 36.0 g/dL Final   RDW 03/18/2021 12.9  11.0 - 15.0 % Final   Platelets 03/18/2021 167  140 - 400 Thousand/uL Final   MPV 03/18/2021 10.7  7.5 - 12.5 fL Final   Neutro Abs 03/18/2021 2,475  1,500 - 7,800 cells/uL Final   Lymphs Abs 03/18/2021 1,573  850 - 3,900 cells/uL Final   Absolute Monocytes 03/18/2021 432  200 - 950 cells/uL Final   Eosinophils Absolute 03/18/2021 101  15 - 500 cells/uL Final   Basophils Absolute 03/18/2021 18  0 - 200 cells/uL Final   Neutrophils Relative % 03/18/2021 53.8  % Final   Total Lymphocyte 03/18/2021 34.2  % Final   Monocytes Relative 03/18/2021 9.4   % Final   Eosinophils Relative 03/18/2021 2.2  % Final   Basophils Relative 03/18/2021 0.4  % Final   Glucose, Bld 03/18/2021 86  65 - 99 mg/dL Final   Comment: .            Fasting reference interval .    BUN 03/18/2021 40 (A) 7 - 25 mg/dL Final   Creat 03/18/2021 2.46 (A) 0.70 - 1.35 mg/dL Final   eGFR 03/18/2021 28 (A) > OR = 60 mL/min/1.19m Final   Comment: The eGFR is based on the CKD-EPI 2021 equation. To calculate  the new eGFR from a previous Creatinine or Cystatin C result, go to https://www.kidney.org/professionals/ kdoqi/gfr%5Fcalculator    BUN/Creatinine Ratio 03/18/2021 16  6 - 22 (calc) Final   Sodium 03/18/2021 140  135 - 146 mmol/L Final   Potassium 03/18/2021 5.5 (A) 3.5 - 5.3 mmol/L Final   Chloride 03/18/2021 107  98 - 110 mmol/L Final   CO2 03/18/2021 25  20 - 32 mmol/L Final   Calcium 03/18/2021 9.2  8.6 - 10.3 mg/dL Final   Total Protein 03/18/2021 6.1  6.1 - 8.1 g/dL Final   Albumin 03/18/2021 4.2  3.6 - 5.1 g/dL Final   Globulin 03/18/2021 1.9  1.9 - 3.7 g/dL (calc) Final   AG Ratio 03/18/2021 2.2  1.0 - 2.5 (calc)  Final   Total Bilirubin 03/18/2021 0.5  0.2 - 1.2 mg/dL Final   Alkaline phosphatase (APISO) 03/18/2021 74  35 - 144 U/L Final   AST 03/18/2021 23  10 - 35 U/L Final   ALT 03/18/2021 21  9 - 46 U/L Final   Cholesterol 03/18/2021 162  <200 mg/dL Final   HDL 03/18/2021 41  > OR = 40 mg/dL Final   Triglycerides 03/18/2021 67  <150 mg/dL Final   LDL Cholesterol (Calc) 03/18/2021 106 (A) mg/dL (calc) Final   Comment: Reference range: <100 . Desirable range <100 mg/dL for primary prevention;   <70 mg/dL for patients with CHD or diabetic patients  with > or = 2 CHD risk factors. Marland Kitchen LDL-C is now calculated using the Martin-Hopkins  calculation, which is a validated novel method providing  better accuracy than the Friedewald equation in the  estimation of LDL-C.  Cresenciano Genre et al. Annamaria Helling. 7902;409(73): 2061-2068   (http://education.[facility].com/faq/FAQ164)    Total CHOL/HDL Ratio 03/18/2021 4.0  <5.0 (calc) Final   Non-HDL Cholesterol (Calc) 03/18/2021 121  <130 mg/dL (calc) Final   Comment: For patients with diabetes plus 1 major ASCVD risk  factor, treating to a non-HDL-C goal of <100 mg/dL  (LDL-C of <70 mg/dL) is considered a therapeutic  option.    PSA 03/18/2021 2.04  < OR = 4.00 ng/mL Final   Comment: The total PSA value from this assay system is  standardized against the WHO standard. The test  result will be approximately 20% lower when compared  to the equimolar-standardized total PSA (Beckman  Coulter). Comparison of serial PSA results should be  interpreted with this fact in mind. . This test was performed using the Siemens  chemiluminescent method. Values obtained from  different assay methods cannot be used interchangeably. PSA levels, regardless of value, should not be interpreted as absolute evidence of the presence or absence of disease.     Past Medical History  Diagnosis Date   PMR (polymyalgia rheumatica)    DDD (degenerative disc disease)    Acute renal failure    Gout    Nephrolithiasis    Ureteral obstruction    No past surgical history on file. Current Outpatient Prescriptions on File Prior to Visit  Medication Sig Dispense Refill   allopurinol (ZYLOPRIM) 100 MG tablet Take 200 mg by mouth daily.         glucosamine-chondroitin 500-400 MG tablet Take 1 tablet by mouth 3 (three) times daily.         Multiple Vitamins-Minerals (MULTIVITAMIN,TX-MINERALS) tablet Take 1 tablet by mouth daily.         No current facility-administered medications on file prior to visit.   Allergies  Allergen Reactions   Sulfa Antibiotics    History   Social History   Marital Status: Married    Spouse Name: N/A    Number of Children: N/A   Years of Education: N/A   Occupational History   Not on file.   Social History Main Topics   Smoking status: Never  Smoker    Smokeless tobacco: Not on file   Alcohol Use: No   Drug Use: No   Sexual Activity: Not on file   Other Topics Concern   Not on file   Social History Narrative   No narrative on file   Family history significant for father with coronary artery disease and a family history of coronary artery disease on his father's side.    Review of Systems  All  other systems reviewed and are negative.     Objective:   Physical Exam Vitals reviewed.  Constitutional:      General: He is not in acute distress.    Appearance: Normal appearance. He is normal weight. He is not ill-appearing, toxic-appearing or diaphoretic.  HENT:     Head: Normocephalic and atraumatic.     Right Ear: Tympanic membrane, ear canal and external ear normal. There is no impacted cerumen.     Left Ear: Tympanic membrane, ear canal and external ear normal. There is no impacted cerumen.     Nose: Nose normal. No congestion.     Mouth/Throat:     Mouth: Mucous membranes are moist.     Pharynx: Oropharynx is clear. No oropharyngeal exudate.  Eyes:     Extraocular Movements: Extraocular movements intact.     Conjunctiva/sclera: Conjunctivae normal.     Pupils: Pupils are equal, round, and reactive to light.  Neck:     Vascular: No carotid bruit.  Cardiovascular:     Rate and Rhythm: Normal rate and regular rhythm.     Pulses: Normal pulses.     Heart sounds: Normal heart sounds. No murmur heard.   No friction rub. No gallop.  Pulmonary:     Effort: Pulmonary effort is normal. No respiratory distress.     Breath sounds: Normal breath sounds. No stridor. No wheezing, rhonchi or rales.  Abdominal:     General: Abdomen is flat. Bowel sounds are normal. There is no distension.     Tenderness: There is no abdominal tenderness. There is no guarding or rebound.  Musculoskeletal:     Cervical back: Normal range of motion and neck supple. No rigidity or tenderness.  Lymphadenopathy:     Cervical: No cervical  adenopathy.  Skin:    General: Skin is warm.     Coloration: Skin is not jaundiced.     Findings: No bruising, erythema or lesion.  Neurological:     General: No focal deficit present.     Mental Status: He is alert and oriented to person, place, and time. Mental status is at baseline.     Cranial Nerves: No cranial nerve deficit.     Sensory: No sensory deficit.     Motor: No weakness.     Coordination: Coordination normal.     Gait: Gait normal.     Deep Tendon Reflexes: Reflexes normal.  Psychiatric:        Mood and Affect: Mood normal.        Behavior: Behavior normal.        Thought Content: Thought content normal.        Judgment: Judgment normal.          Assessment & Plan:  Stage 3b chronic kidney disease ([place]) - Plan: Protein, urine, 24 hour  Need for prophylactic vaccination against Streptococcus pneumoniae (pneumococcus) - Plan: Pneumococcal conjugate vaccine 20-valent  General medical exam Check 24 hour protein.  If elevated, recommend adding farxiga for renal protection.  Avoid ace-I due to elevated K.  Colonoscopy UTD.  PSA stable.  Cholesterol excllent.  Received prevnar.  Recommend shingrix.

## 2021-03-28 ENCOUNTER — Other Ambulatory Visit: Payer: Self-pay

## 2021-03-28 ENCOUNTER — Other Ambulatory Visit: Payer: Medicare Other

## 2021-03-28 DIAGNOSIS — N1832 Chronic kidney disease, stage 3b: Secondary | ICD-10-CM | POA: Diagnosis not present

## 2021-03-29 LAB — PROTEIN, URINE, 24 HOUR: Protein, 24H Urine: 200 mg/24 h — ABNORMAL HIGH (ref 0–149)

## 2021-03-31 ENCOUNTER — Other Ambulatory Visit: Payer: Self-pay | Admitting: *Deleted

## 2021-03-31 DIAGNOSIS — N1832 Chronic kidney disease, stage 3b: Secondary | ICD-10-CM

## 2021-04-29 DIAGNOSIS — E875 Hyperkalemia: Secondary | ICD-10-CM | POA: Diagnosis not present

## 2021-04-29 DIAGNOSIS — M1000 Idiopathic gout, unspecified site: Secondary | ICD-10-CM | POA: Diagnosis not present

## 2021-04-29 DIAGNOSIS — N200 Calculus of kidney: Secondary | ICD-10-CM | POA: Diagnosis not present

## 2021-04-29 DIAGNOSIS — N1832 Chronic kidney disease, stage 3b: Secondary | ICD-10-CM | POA: Diagnosis not present

## 2021-05-02 ENCOUNTER — Other Ambulatory Visit: Payer: Self-pay | Admitting: Internal Medicine

## 2021-05-02 DIAGNOSIS — N1832 Chronic kidney disease, stage 3b: Secondary | ICD-10-CM

## 2021-05-05 ENCOUNTER — Other Ambulatory Visit: Payer: Self-pay | Admitting: *Deleted

## 2021-05-05 MED ORDER — ALLOPURINOL 100 MG PO TABS: 200.0000 mg | ORAL_TABLET | Freq: Every day | ORAL | 2 refills | Status: DC

## 2021-05-10 ENCOUNTER — Other Ambulatory Visit: Payer: Self-pay

## 2021-05-10 ENCOUNTER — Ambulatory Visit
Admission: RE | Admit: 2021-05-10 | Discharge: 2021-05-10 | Disposition: A | Discharge status: Home / Self Care | Payer: Medicare Other | Source: Ambulatory Visit | Attending: Internal Medicine | Admitting: Internal Medicine

## 2021-05-10 ENCOUNTER — Other Ambulatory Visit: Payer: Self-pay | Admitting: Family Medicine

## 2021-05-10 ENCOUNTER — Ambulatory Visit
Admission: RE | Admit: 2021-05-10 | Discharge: 2021-05-10 | Disposition: A | Discharge status: Home / Self Care | Payer: Medicare Other | Source: Ambulatory Visit | Attending: Family Medicine | Admitting: Family Medicine

## 2021-05-10 DIAGNOSIS — M25561 Pain in right knee: Secondary | ICD-10-CM

## 2021-05-10 DIAGNOSIS — N200 Calculus of kidney: Secondary | ICD-10-CM | POA: Diagnosis not present

## 2021-05-10 DIAGNOSIS — M11261 Other chondrocalcinosis, right knee: Secondary | ICD-10-CM | POA: Diagnosis not present

## 2021-05-10 DIAGNOSIS — N1832 Chronic kidney disease, stage 3b: Secondary | ICD-10-CM

## 2021-05-10 DIAGNOSIS — N261 Atrophy of kidney (terminal): Secondary | ICD-10-CM | POA: Diagnosis not present

## 2021-05-10 DIAGNOSIS — N281 Cyst of kidney, acquired: Secondary | ICD-10-CM | POA: Diagnosis not present

## 2021-05-10 DIAGNOSIS — M1711 Unilateral primary osteoarthritis, right knee: Secondary | ICD-10-CM | POA: Diagnosis not present

## 2021-05-13 ENCOUNTER — Other Ambulatory Visit: Payer: Self-pay | Admitting: Family Medicine

## 2021-05-13 DIAGNOSIS — N2889 Other specified disorders of kidney and ureter: Secondary | ICD-10-CM

## 2021-06-02 ENCOUNTER — Ambulatory Visit: Payer: Medicare Other

## 2021-06-14 ENCOUNTER — Other Ambulatory Visit: Payer: Self-pay

## 2021-06-14 ENCOUNTER — Ambulatory Visit
Admission: RE | Admit: 2021-06-14 | Discharge: 2021-06-14 | Disposition: A | Discharge status: Home / Self Care | Payer: Medicare Other | Source: Ambulatory Visit | Attending: Family Medicine | Admitting: Family Medicine

## 2021-06-14 DIAGNOSIS — K7689 Other specified diseases of liver: Secondary | ICD-10-CM | POA: Diagnosis not present

## 2021-06-14 DIAGNOSIS — N281 Cyst of kidney, acquired: Secondary | ICD-10-CM | POA: Diagnosis not present

## 2021-06-14 DIAGNOSIS — N2889 Other specified disorders of kidney and ureter: Secondary | ICD-10-CM | POA: Diagnosis not present

## 2021-06-14 MED ORDER — GADOBUTROL 1 MMOL/ML IV SOLN
7.5000 mL | Freq: Once | INTRAVENOUS | Status: AC | PRN
  Administered 2021-06-14: 7.5 mL via INTRAVENOUS

## 2021-06-21 ENCOUNTER — Other Ambulatory Visit: Payer: Self-pay

## 2021-06-21 ENCOUNTER — Ambulatory Visit (INDEPENDENT_AMBULATORY_CARE_PROVIDER_SITE_OTHER): Payer: Medicare Other | Admitting: Family Medicine

## 2021-06-21 ENCOUNTER — Encounter: Payer: Self-pay | Admitting: Family Medicine

## 2021-06-21 VITALS — BP 106/64 | HR 70 | Temp 100.1°F | Resp 20 | Ht 67.0 in | Wt 176.0 lb

## 2021-06-21 DIAGNOSIS — R509 Fever, unspecified: Secondary | ICD-10-CM | POA: Diagnosis not present

## 2021-06-21 LAB — URINALYSIS, ROUTINE W REFLEX MICROSCOPIC
Bacteria, UA: NONE SEEN /HPF
Bilirubin Urine: NEGATIVE
Glucose, UA: NEGATIVE
Hyaline Cast: NONE SEEN /LPF
Ketones, ur: NEGATIVE
Nitrite: NEGATIVE
Protein, ur: NEGATIVE
Specific Gravity, Urine: 1.005 (ref 1.001–1.035)
pH: 6 (ref 5.0–8.0)

## 2021-06-21 LAB — MICROSCOPIC MESSAGE

## 2021-06-21 MED ORDER — CEFTRIAXONE SODIUM 1 G IJ SOLR
1.0000 g | Freq: Once | INTRAMUSCULAR | Status: AC
Start: 2021-06-21 — End: 2021-06-21
  Administered 2021-06-21: 1 g via INTRAMUSCULAR

## 2021-06-21 MED ORDER — CEPHALEXIN 500 MG PO CAPS: 500.0000 mg | ORAL_CAPSULE | Freq: Three times a day (TID) | ORAL | 0 refills | Status: DC

## 2021-06-21 NOTE — Addendum Note (Signed)
Addended by: Sheral Flow on: 06/21/2021 11:05 AM   Modules accepted: Orders

## 2021-06-21 NOTE — Progress Notes (Signed)
Subjective:    Patient ID: David Lawson, male    DOB: 27-Oct-1950, 70 y.o.   MRN: [id number]  HPI Patient is a very pleasant David Lawson with a history of recurrent urinary tract infections.  Over the weekend David Lawson developed dysuria and David Lawson began taking some old Cipro pills that David Lawson had.  David Lawson was taking 250 mg twice daily.  Symptoms began on Saturday.  David Lawson continued to have off-and-on fevers Sunday and Monday.  By Monday night David Lawson was having rigors and was also still having orthostatic hypotension.  I witnessed this personally at church as we work together on the [employer].  Therefore I had the patient come to my office last night and I gave him a shot of Rocephin.  I was concerned that the patient may have a resistant urinary tract infection given his history of repeated use of Cipro in the past.  David Lawson states that David Lawson feels much better.  Unfortunately the urine sample is compromised.  That being said it still shows trace blood and trace leukocyte esterase.  David Lawson continues to have a low-grade fever of 100.1 but David Lawson feels much better than yesterday.  David Lawson is made a concerted effort to drink copious amounts of Gatorade through the evening.  David Lawson is no longer having orthostatic hypotension. Office Visit on 06/21/2021  Component Date Value Ref Range Status   Color, Urine 06/21/2021 LIGHT YELLOW  YELLOW Final   APPearance 06/21/2021 CLEAR  CLEAR Final   Specific Gravity, Urine 06/21/2021 1.005  1.001 - 1.035 Final   Comment: Verified by repeat analysis. Marland Kitchen    pH 06/21/2021 6.0  5.0 - 8.0 Final   Glucose, UA 06/21/2021 NEGATIVE  NEGATIVE Final   Bilirubin Urine 06/21/2021 NEGATIVE  NEGATIVE Final   Ketones, ur 06/21/2021 NEGATIVE  NEGATIVE Final   Hgb urine dipstick 06/21/2021 TRACE (A) NEGATIVE Final   Protein, ur 06/21/2021 NEGATIVE  NEGATIVE Final   Nitrite 06/21/2021 NEGATIVE  NEGATIVE Final   Leukocytes,Ua 06/21/2021 TRACE (A) NEGATIVE Final   WBC, UA 06/21/2021 0-5  0 - 5 /HPF Final   RBC / HPF  06/21/2021 0-2  0 - 2 /HPF Final   Squamous Epithelial / LPF 06/21/2021 0-5  < OR = 5 /HPF Final   Bacteria, UA 06/21/2021 NONE SEEN  NONE SEEN /HPF Final   Hyaline Cast 06/21/2021 NONE SEEN  NONE SEEN /LPF Final   Note 06/21/2021    Final   Comment: This urine was analyzed for the presence of WBC,  RBC, bacteria, casts, and other formed elements.  Only those elements seen were reported. . .     Past Medical History:  Diagnosis Date   Blood transfusion without reported diagnosis    as a child   CKD (chronic kidney disease) stage 3, GFR 30-59 ml/min ([facility])    DDD (degenerative disc disease)    Gout    Nephrolithiasis    PMR (polymyalgia rheumatica) ([facility])    Ureteral obstruction    Past Surgical History:  Procedure Laterality Date   bone spur     foot   COLONOSCOPY     HERNIA REPAIR     KNEE SURGERY Right    SHOULDER SURGERY Bilateral    TRIGGER FINGER RELEASE     x4   URETER SURGERY     x3   Current Outpatient Medications on File Prior to Visit  Medication Sig Dispense Refill   allopurinol (ZYLOPRIM) 100 MG tablet Take 2 tablets (200 mg total) by  mouth daily. 180 tablet 2   glucosamine-chondroitin 500-400 MG tablet Take 1 tablet by mouth 3 (three) times daily.       Multiple Vitamins-Minerals (MULTIVITAMIN,TX-MINERALS) tablet Take 1 tablet by mouth daily.       niacin 500 MG tablet Take 500 mg by mouth 2 (two) times daily with a meal.     Colchicine (MITIGARE) 0.6 MG CAPS Take 0.6 mg by mouth daily. (Patient not taking: Reported on 06/21/2021) 30 capsule 3   No current facility-administered medications on file prior to visit.   Allergies  Allergen Reactions   Sulfa Antibiotics     Unsure of reaction- as a child   Social History   Socioeconomic History   Marital status: Married    Spouse name: Not on file   Number of children: Not on file   Years of education: Not on file   Highest education level: Not on file  Occupational History   Not on file  Tobacco Use    Smoking status: Never   Smokeless tobacco: Never  Vaping Use   Vaping Use: Never used  Substance and Sexual Activity   Alcohol use: Not Currently   Drug use: No   Sexual activity: Not on file  Other Topics Concern   Not on file  Social History Narrative   Not on file   Social Determinants of Health   Financial Resource Strain: Not on file  Food Insecurity: Not on file  Transportation Needs: Not on file  Physical Activity: Not on file  Stress: Not on file  Social Connections: Not on file  Intimate Partner Violence: Not on file   Family history significant for father with coronary artery disease and a family history of coronary artery disease on his father's side.    Review of Systems  All other systems reviewed and are negative.     Objective:   Physical Exam Vitals reviewed.  Constitutional:      General: David Lawson is not in acute distress.    Appearance: Normal appearance. David Lawson is normal weight. David Lawson is not ill-appearing, toxic-appearing or diaphoretic.  HENT:     Head: Normocephalic and atraumatic.     Right Ear: Tympanic membrane, ear canal and external ear normal. There is no impacted cerumen.     Left Ear: Tympanic membrane, ear canal and external ear normal. There is no impacted cerumen.     Nose: Nose normal. No congestion.     Mouth/Throat:     Mouth: Mucous membranes are moist.     Pharynx: Oropharynx is clear. No oropharyngeal exudate.  Eyes:     Extraocular Movements: Extraocular movements intact.     Conjunctiva/sclera: Conjunctivae normal.     Pupils: Pupils are equal, round, and reactive to light.  Neck:     Vascular: No carotid bruit.  Cardiovascular:     Rate and Rhythm: Normal rate and regular rhythm.     Pulses: Normal pulses.     Heart sounds: Normal heart sounds. No murmur heard.   No friction rub. No gallop.  Pulmonary:     Effort: Pulmonary effort is normal. No respiratory distress.     Breath sounds: Normal breath sounds. No stridor. No  wheezing, rhonchi or rales.  Abdominal:     General: Abdomen is flat. Bowel sounds are normal. There is no distension.     Tenderness: There is no abdominal tenderness. There is no guarding or rebound.  Musculoskeletal:     Cervical back: Normal range of motion and  neck supple. No rigidity or tenderness.  Lymphadenopathy:     Cervical: No cervical adenopathy.  Skin:    General: Skin is warm.     Coloration: Skin is not jaundiced.     Findings: No bruising, erythema or lesion.  Neurological:     General: No focal deficit present.     Mental Status: David Lawson is alert and oriented to person, place, and time. Mental status is at baseline.     Cranial Nerves: No cranial nerve deficit.     Sensory: No sensory deficit.     Motor: No weakness.     Coordination: Coordination normal.     Gait: Gait normal.     Deep Tendon Reflexes: Reflexes normal.  Psychiatric:        Mood and Affect: Mood normal.        Behavior: Behavior normal.        Thought Content: Thought content normal.        Judgment: Judgment normal.          Assessment & Plan:  Fever, unspecified fever cause - Plan: Urinalysis, Routine w reflex microscopic, CBC with Differential/Platelet, COMPLETE METABOLIC PANEL WITH GFR I suspect pyelonephritis versus prostatitis.  I believe that the patient likely had a resistant infection and that is why Cipro was not working.  David Lawson was starting to have signs of systemic illness so I treated him with Rocephin.  I will give him a gram of Rocephin again today and then reassess tomorrow.  If David Lawson is afebrile, David Lawson can switch to Keflex 500 mg p.o. 3 times daily at that point to complete 7 days of treatment.  Also check CBC and CMP.  We will continue Rocephin every 24 hours as long as the patient is improving until David Lawson is afebrile.

## 2021-06-22 ENCOUNTER — Ambulatory Visit (INDEPENDENT_AMBULATORY_CARE_PROVIDER_SITE_OTHER): Payer: Medicare Other | Admitting: *Deleted

## 2021-06-22 DIAGNOSIS — R509 Fever, unspecified: Secondary | ICD-10-CM | POA: Diagnosis not present

## 2021-06-22 LAB — CBC WITH DIFFERENTIAL/PLATELET
Absolute Monocytes: 1334 cells/uL — ABNORMAL HIGH (ref 200–950)
Basophils Absolute: 42 cells/uL (ref 0–200)
Basophils Relative: 0.3 %
Eosinophils Absolute: 139 cells/uL (ref 15–500)
Eosinophils Relative: 1 %
HCT: 37.2 % — ABNORMAL LOW (ref 38.5–50.0)
Hemoglobin: 12.8 g/dL — ABNORMAL LOW (ref 13.2–17.1)
Lymphs Abs: 1585 cells/uL (ref 850–3900)
MCH: 32.4 pg (ref 27.0–33.0)
MCHC: 34.4 g/dL (ref 32.0–36.0)
MCV: 94.2 fL (ref 80.0–100.0)
MPV: 10.5 fL (ref 7.5–12.5)
Monocytes Relative: 9.6 %
Neutro Abs: 10800 cells/uL — ABNORMAL HIGH (ref 1500–7800)
Neutrophils Relative %: 77.7 %
Platelets: 156 10*3/uL (ref 140–400)
RBC: 3.95 10*6/uL — ABNORMAL LOW (ref 4.20–5.80)
RDW: 12.4 % (ref 11.0–15.0)
Total Lymphocyte: 11.4 %
WBC: 13.9 10*3/uL — ABNORMAL HIGH (ref 3.8–10.8)

## 2021-06-22 LAB — COMPLETE METABOLIC PANEL WITH GFR
AG Ratio: 1.8 (calc) (ref 1.0–2.5)
ALT: 17 U/L (ref 9–46)
AST: 17 U/L (ref 10–35)
Albumin: 3.9 g/dL (ref 3.6–5.1)
Alkaline phosphatase (APISO): 86 U/L (ref 35–144)
BUN/Creatinine Ratio: 15 (calc) (ref 6–22)
BUN: 31 mg/dL — ABNORMAL HIGH (ref 7–25)
CO2: 23 mmol/L (ref 20–32)
Calcium: 8.9 mg/dL (ref 8.6–10.3)
Chloride: 108 mmol/L (ref 98–110)
Creat: 2.01 mg/dL — ABNORMAL HIGH (ref 0.70–1.35)
Globulin: 2.2 g/dL (calc) (ref 1.9–3.7)
Glucose, Bld: 96 mg/dL (ref 65–99)
Potassium: 4.2 mmol/L (ref 3.5–5.3)
Sodium: 140 mmol/L (ref 135–146)
Total Bilirubin: 0.6 mg/dL (ref 0.2–1.2)
Total Protein: 6.1 g/dL (ref 6.1–8.1)
eGFR: 35 mL/min/{1.73_m2} — ABNORMAL LOW (ref >=60)

## 2021-06-22 MED ORDER — CEFTRIAXONE SODIUM 1 G IJ SOLR
1.0000 g | Freq: Once | INTRAMUSCULAR | Status: AC
  Administered 2021-06-22: 1 g via INTRAMUSCULAR

## 2021-07-07 ENCOUNTER — Other Ambulatory Visit: Payer: Self-pay

## 2021-07-07 ENCOUNTER — Other Ambulatory Visit: Payer: Medicare Other

## 2021-07-07 DIAGNOSIS — R300 Dysuria: Secondary | ICD-10-CM

## 2021-07-07 LAB — URINALYSIS, ROUTINE W REFLEX MICROSCOPIC
Bilirubin Urine: NEGATIVE
Glucose, UA: NEGATIVE
Hyaline Cast: NONE SEEN /LPF
Ketones, ur: NEGATIVE
Nitrite: NEGATIVE
Protein, ur: NEGATIVE
Specific Gravity, Urine: 1.01 (ref 1.001–1.035)
Squamous Epithelial / HPF: NONE SEEN /HPF (ref <=5)
pH: 6 (ref 5.0–8.0)

## 2021-07-07 LAB — MICROSCOPIC MESSAGE

## 2021-07-09 LAB — URINE CULTURE
MICRO NUMBER:: 12589656
SPECIMEN QUALITY:: ADEQUATE

## 2021-07-11 ENCOUNTER — Other Ambulatory Visit: Payer: Self-pay | Admitting: Family Medicine

## 2021-07-11 ENCOUNTER — Other Ambulatory Visit: Payer: Self-pay | Admitting: *Deleted

## 2021-07-11 DIAGNOSIS — Z87440 Personal history of urinary (tract) infections: Secondary | ICD-10-CM

## 2021-07-11 MED ORDER — CIPROFLOXACIN HCL 250 MG PO TABS: 250.0000 mg | ORAL_TABLET | Freq: Two times a day (BID) | ORAL | 2 refills | Status: AC

## 2021-07-21 ENCOUNTER — Other Ambulatory Visit: Payer: Medicare Other

## 2021-07-21 ENCOUNTER — Other Ambulatory Visit: Payer: Self-pay

## 2021-07-21 DIAGNOSIS — R8290 Unspecified abnormal findings in urine: Secondary | ICD-10-CM | POA: Diagnosis not present

## 2021-07-21 DIAGNOSIS — Z87440 Personal history of urinary (tract) infections: Secondary | ICD-10-CM | POA: Diagnosis not present

## 2021-07-21 LAB — URINALYSIS, ROUTINE W REFLEX MICROSCOPIC
Bilirubin Urine: NEGATIVE
Glucose, UA: NEGATIVE
Hgb urine dipstick: NEGATIVE
Ketones, ur: NEGATIVE
Leukocytes,Ua: NEGATIVE
Nitrite: NEGATIVE
Protein, ur: NEGATIVE
Specific Gravity, Urine: 1.01 (ref 1.001–1.035)
pH: 6 (ref 5.0–8.0)

## 2021-07-22 LAB — URINE CULTURE
MICRO NUMBER:: 12651529
SPECIMEN QUALITY:: ADEQUATE

## 2021-10-14 DIAGNOSIS — N1832 Chronic kidney disease, stage 3b: Secondary | ICD-10-CM | POA: Diagnosis not present

## 2021-10-17 DIAGNOSIS — N1832 Chronic kidney disease, stage 3b: Secondary | ICD-10-CM | POA: Diagnosis not present

## 2021-10-17 DIAGNOSIS — N200 Calculus of kidney: Secondary | ICD-10-CM | POA: Diagnosis not present

## 2021-10-17 DIAGNOSIS — E875 Hyperkalemia: Secondary | ICD-10-CM | POA: Diagnosis not present

## 2021-10-17 DIAGNOSIS — M1000 Idiopathic gout, unspecified site: Secondary | ICD-10-CM | POA: Diagnosis not present

## 2021-11-16 IMAGING — DX DG KNEE COMPLETE 4+V*R*
4 series · 4 of 4 positions shown · non-contrast
Comparison: None.

CLINICAL DATA: pain, lump below medial joint line

EXAM:
RIGHT KNEE - COMPLETE 4+ VIEW

[dg knee complete 4 views right (1 of 4)]
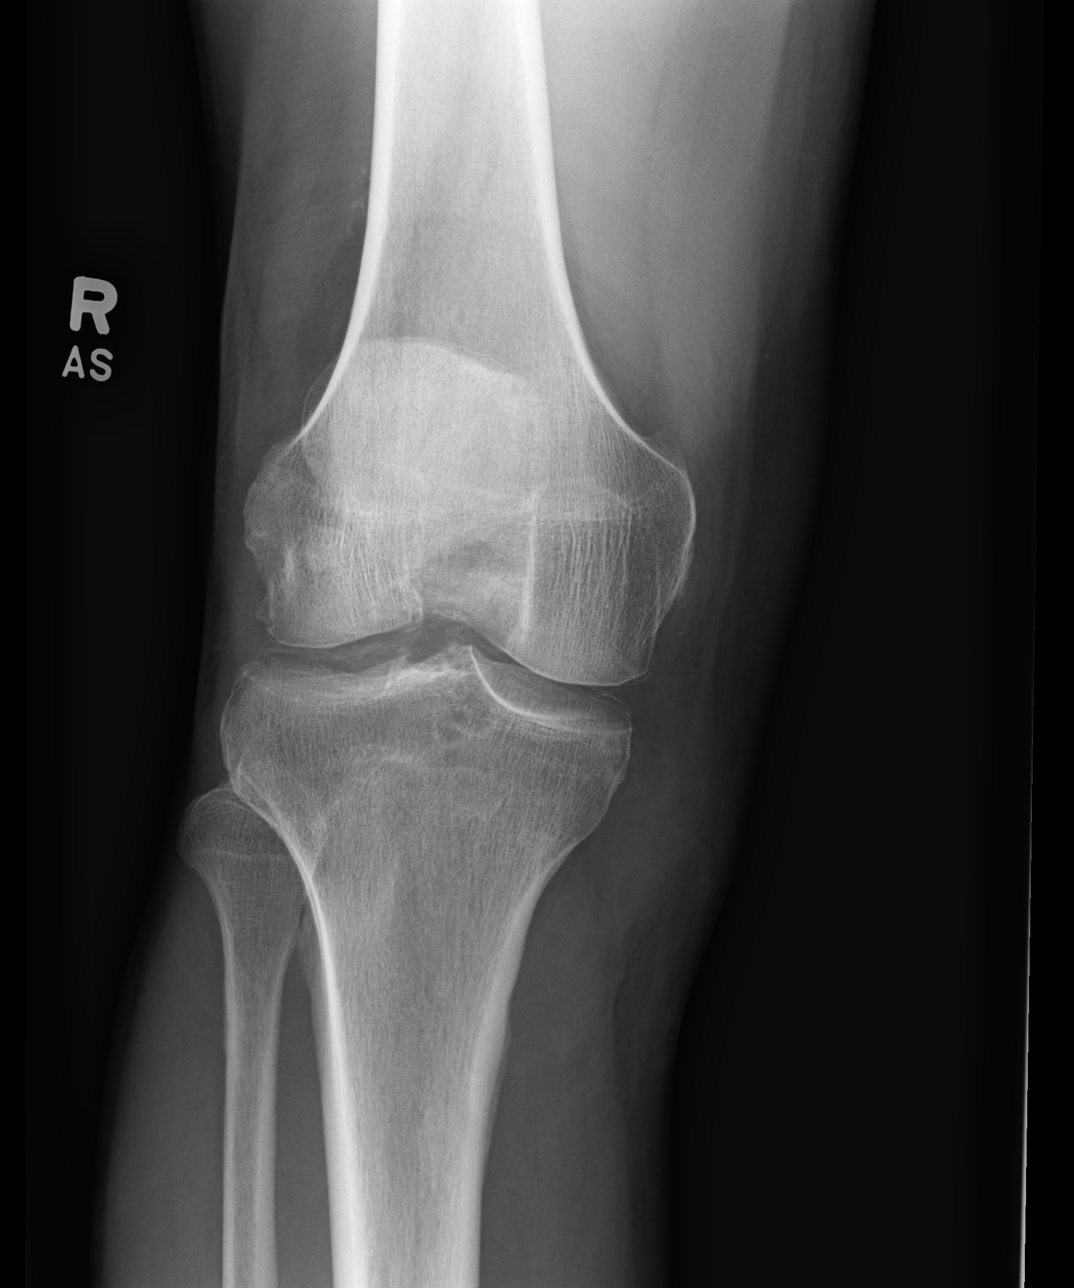

[dg knee complete 4 views right (2 of 4)]
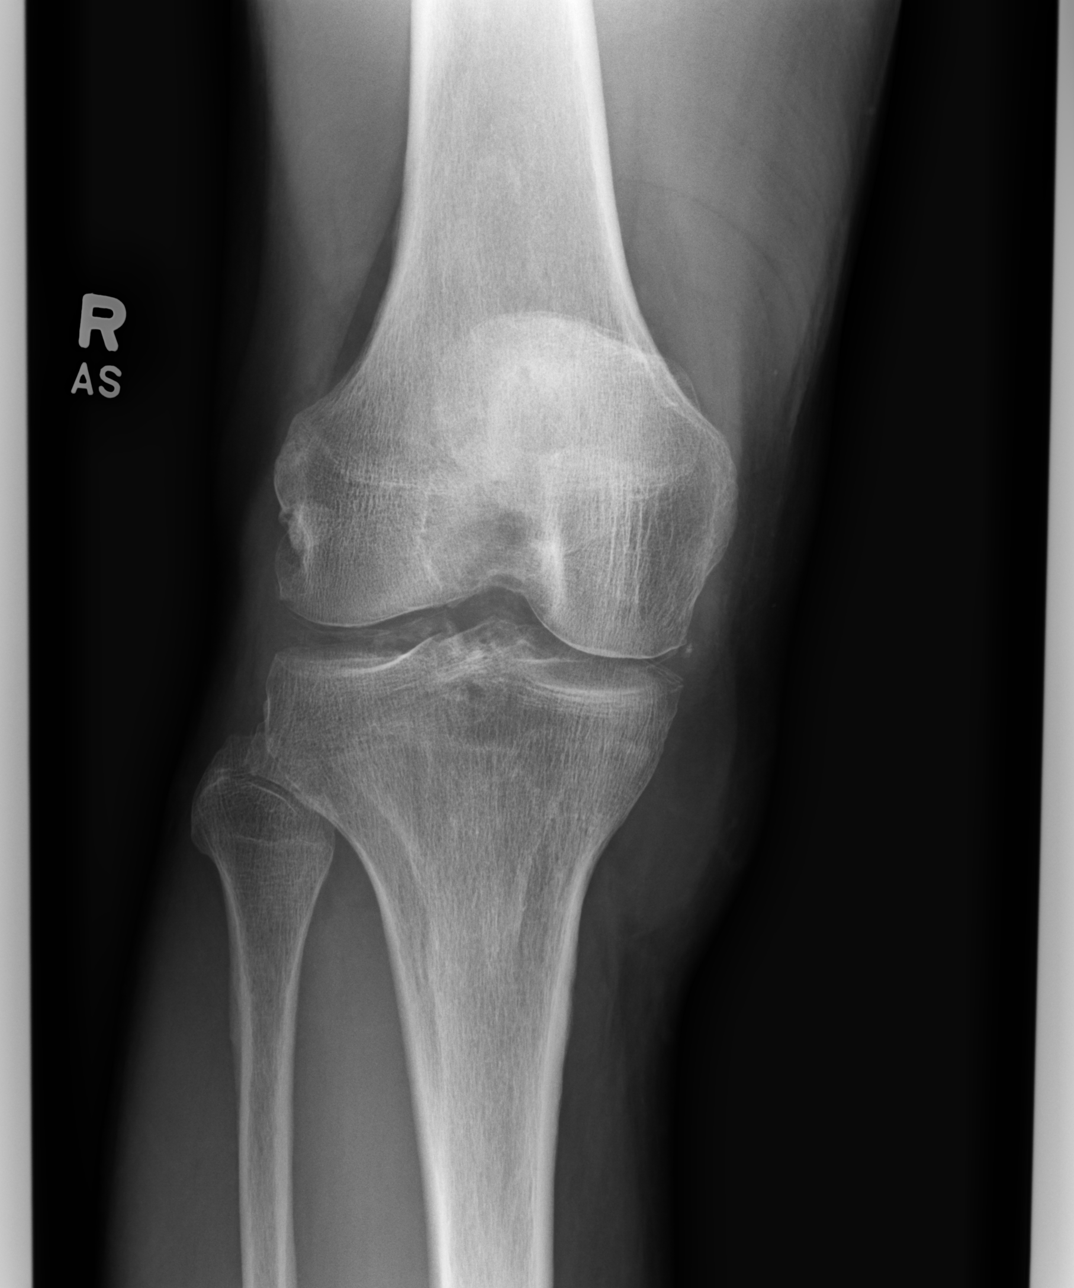

[dg knee complete 4 views right (3 of 4)]
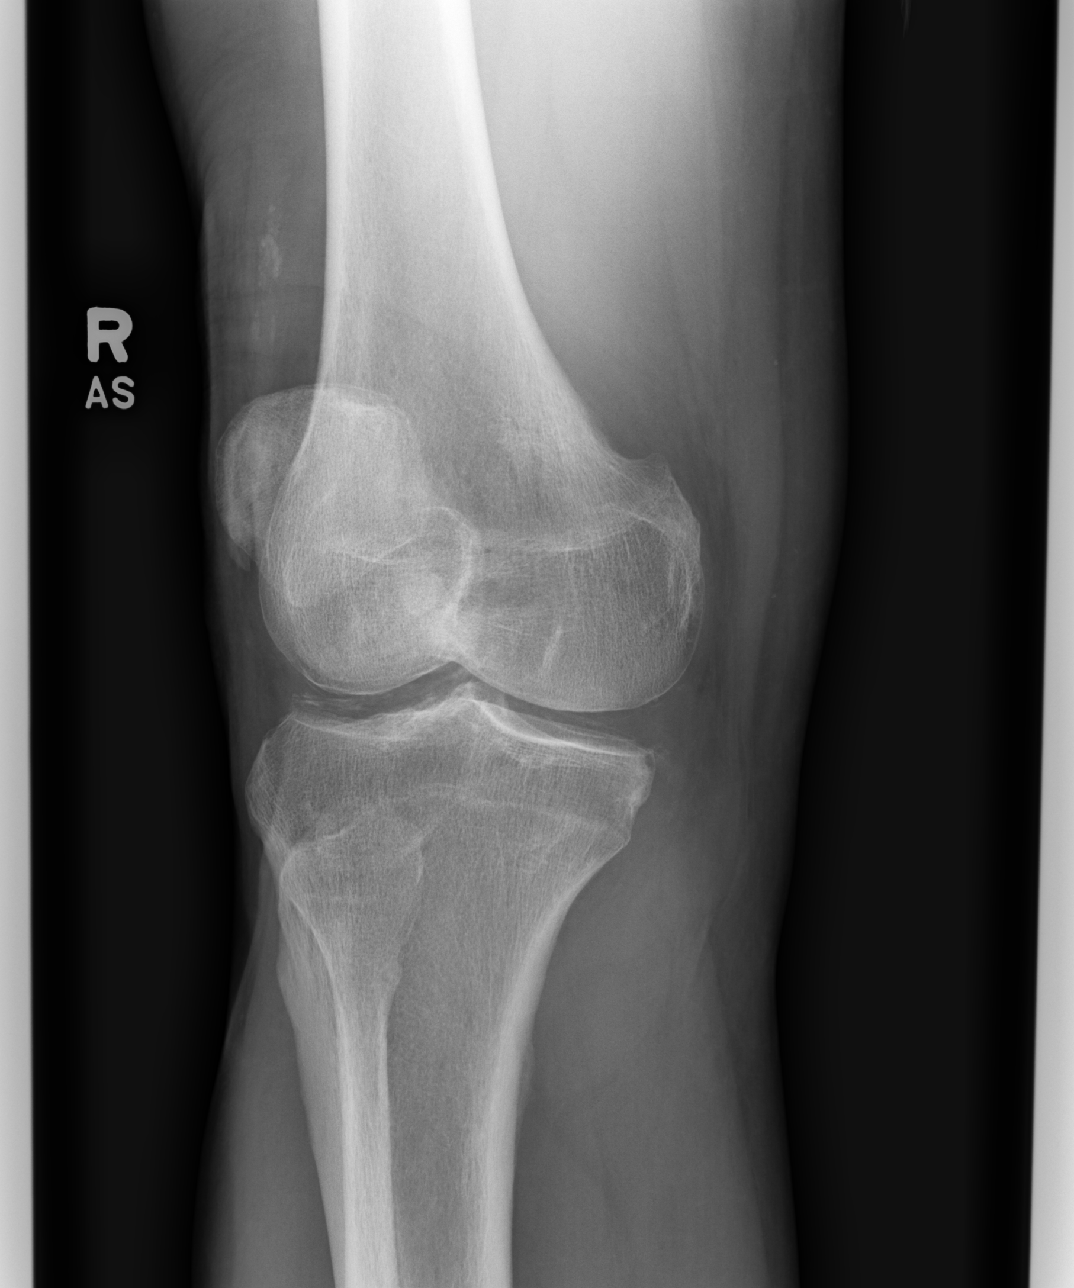

[dg knee complete 4 views right (4 of 4)]
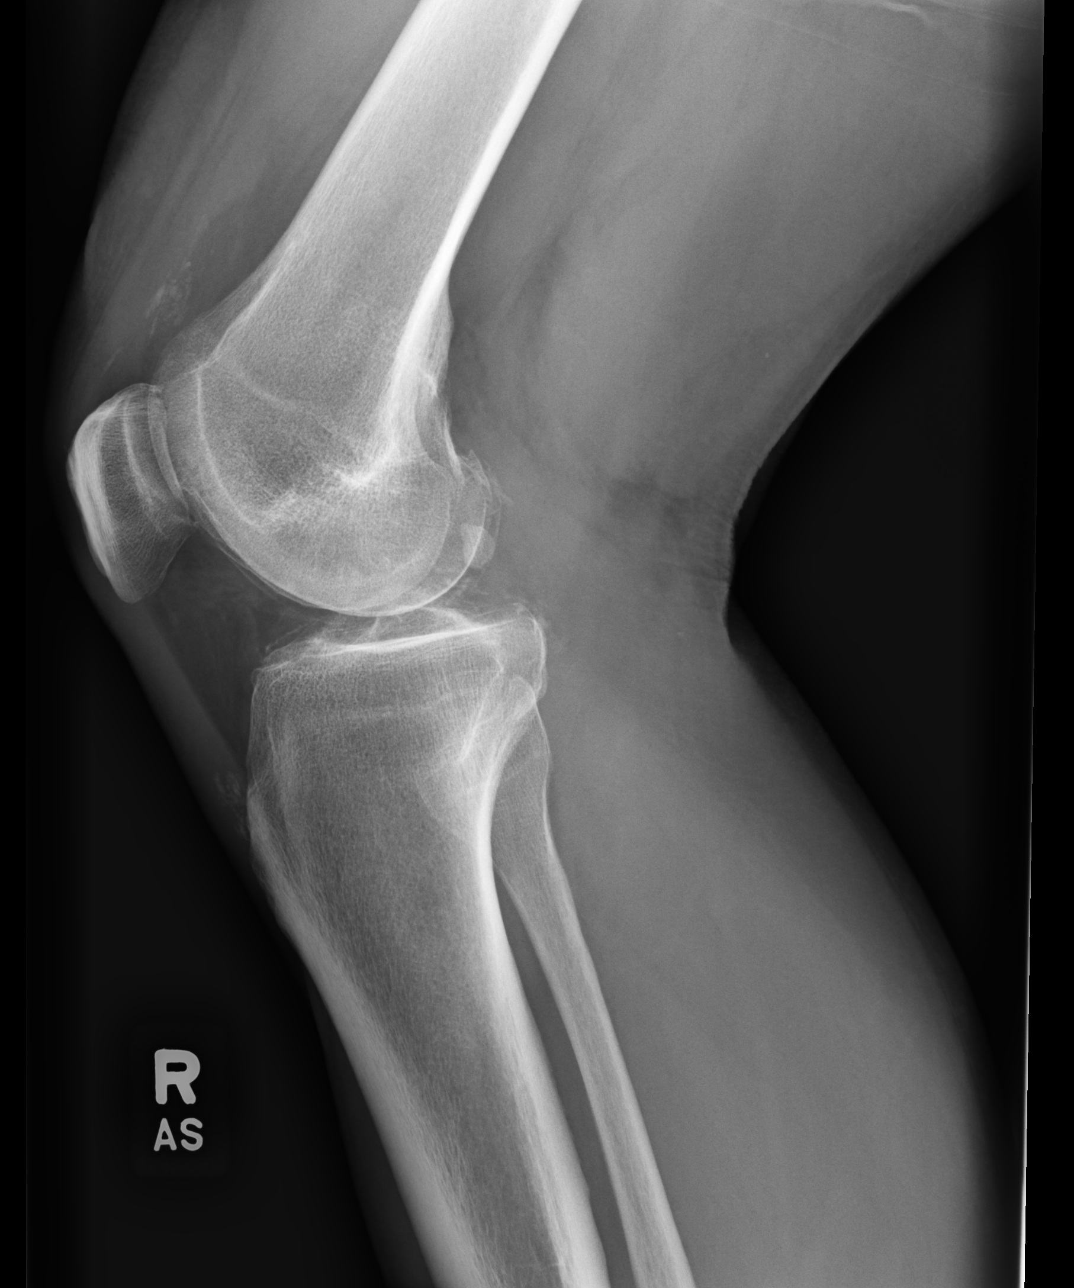

[4 of 4 positions shown; findings below may reference images not displayed]

FINDINGS: Normal alignment. No fracture or dislocation. Chondrocalcinosis of
the medial and lateral menisci are noted, possibly degenerative in
nature. Minimal degenerative arthritis involving the patellofemoral
compartment with tiny osteophyte formation. Joint spaces are
preserved. Dystrophic calcifications are seen within the
suprapatellar soft tissues. No effusion.
IMPRESSION: No acute fracture or dislocation.

Mild degenerative changes as outlined above.

## 2021-11-16 IMAGING — US US RENAL
1 series · 13 of 25 positions shown · non-contrast
Comparison: 08/15/2013

CLINICAL DATA: Stage III B chronic kidney disease

EXAM:
RENAL / URINARY TRACT ULTRASOUND COMPLETE

[Series 1: us renal · 0.23mm/px · 13 of 50 slices shown]
[im 1/50]
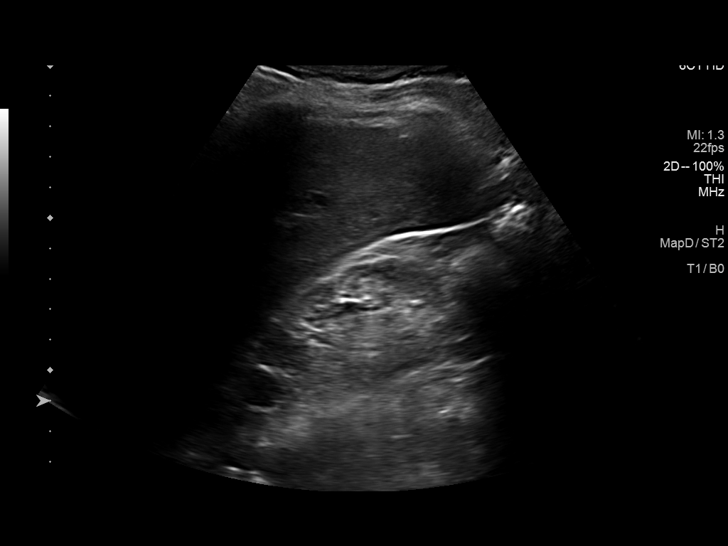
[im 5/50]
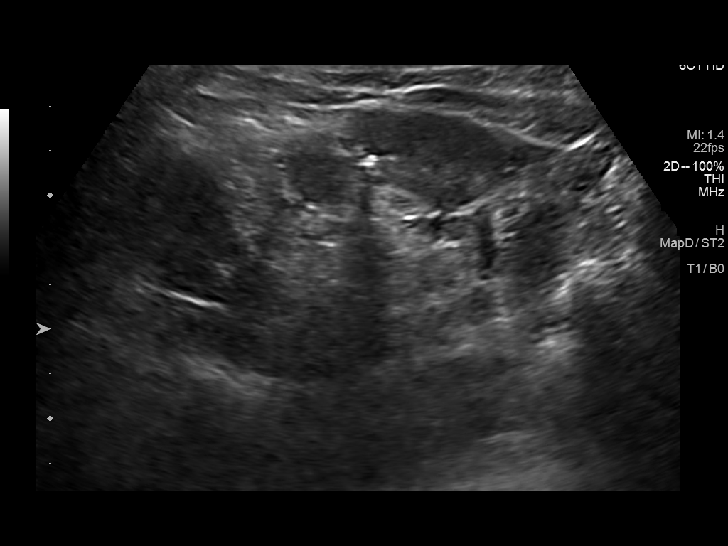
[im 9/50]
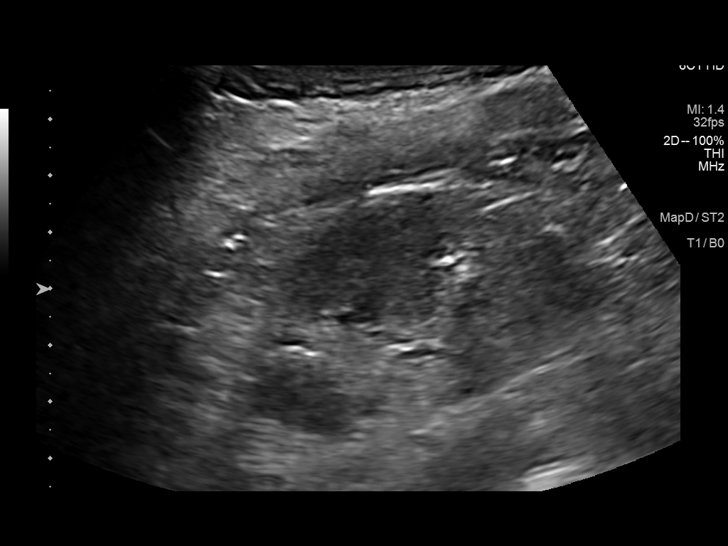
[im 13/50]
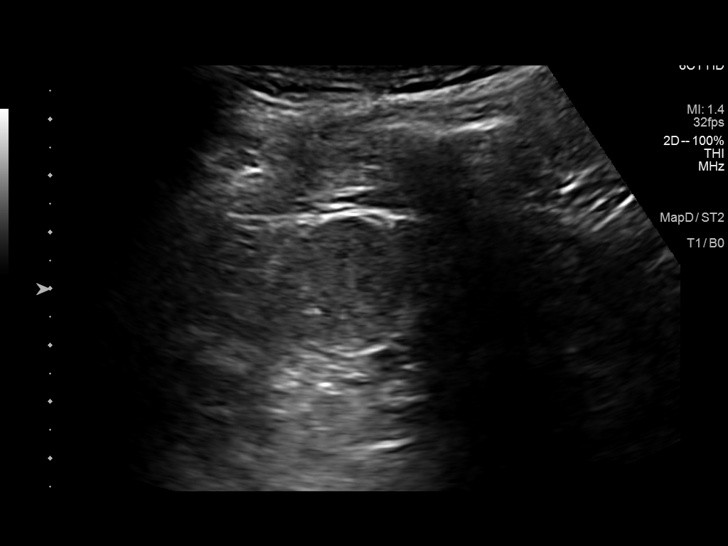
[im 17/50]
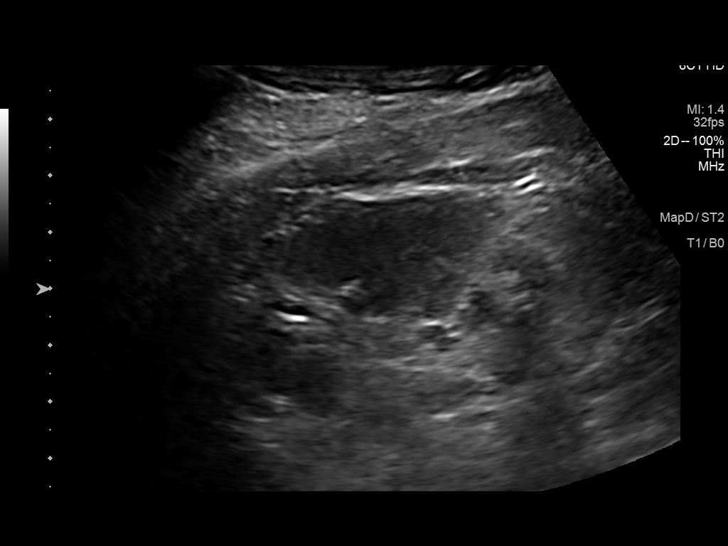
[im 21/50]
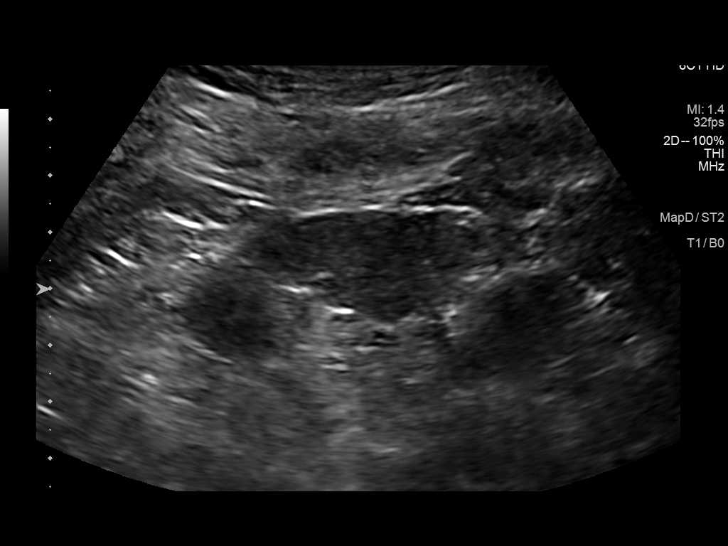
[im 25/50]
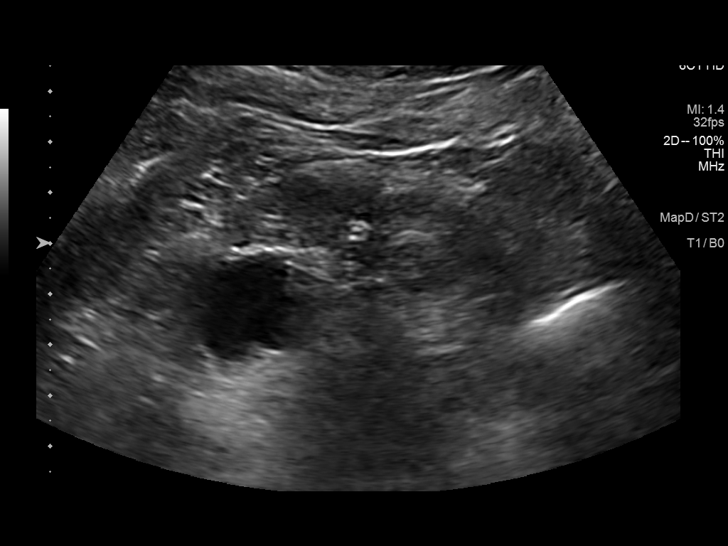
[im 29/50]
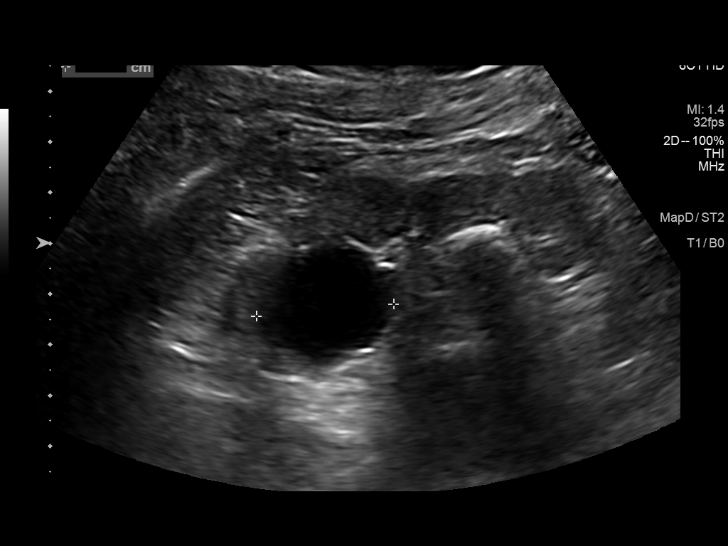
[im 33/50]
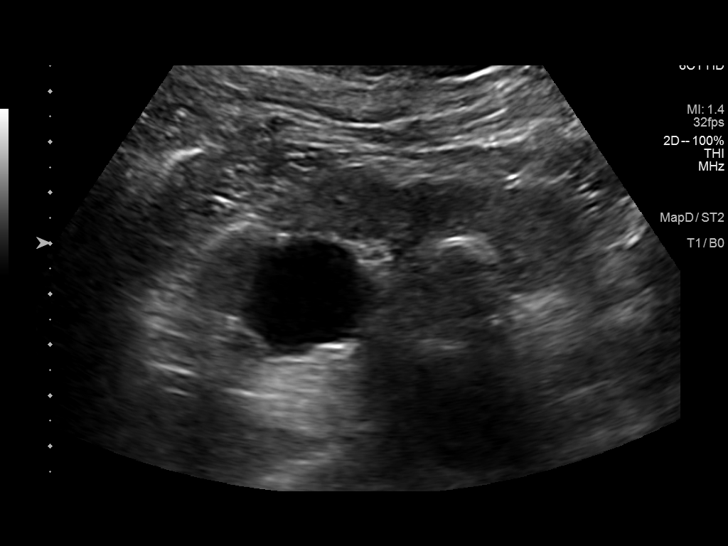
[im 37/50]
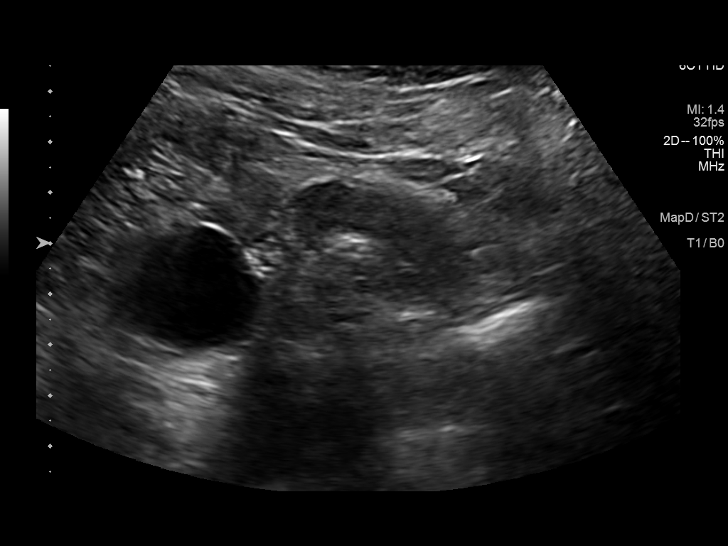
[im 41/50]
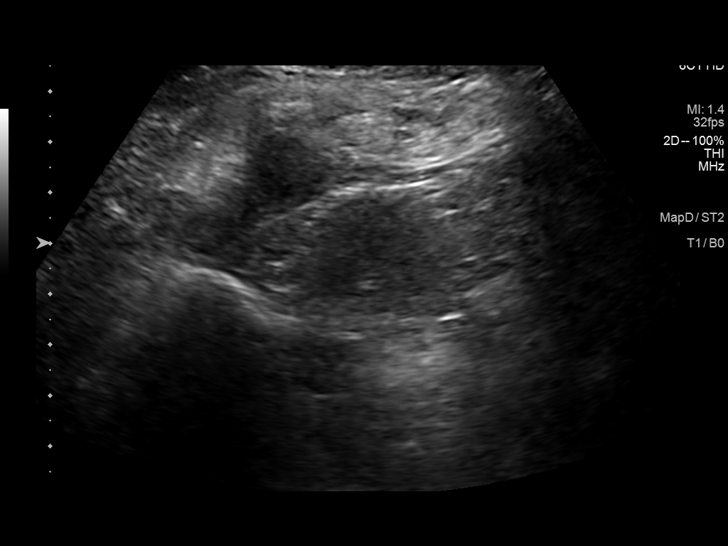
[im 45/50]
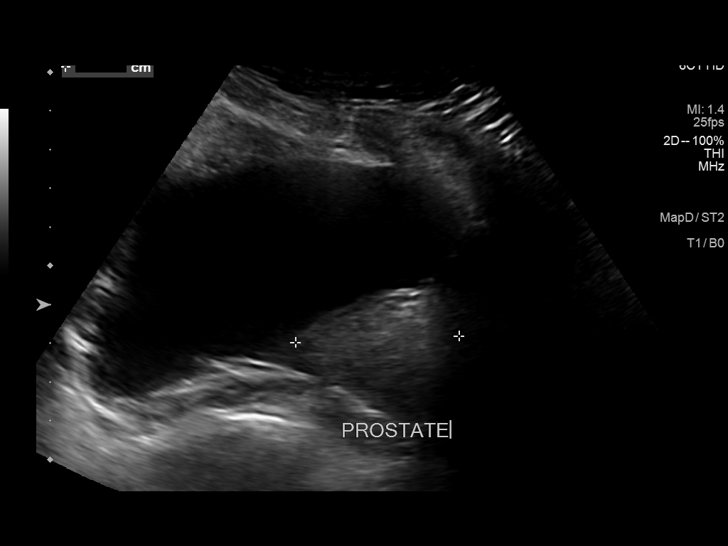
[im 50/50]
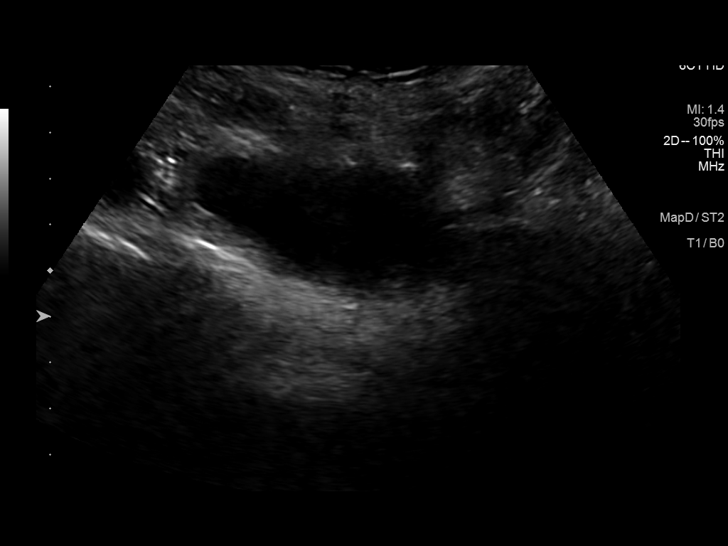

[13 of 25 positions shown; findings below may reference images not displayed]

FINDINGS: Right Kidney:

Renal measurements: 8.4 x 3.5 x 2.6 cm = volume: 40 mL. There is
asymmetric cortical scarring and atrophy of the right kidney.
Cortical echogenicity is normal. There is a rounded hypoechoic
hypervascular region within the interpolar region of the right
kidney best appreciated on image # 15 which may simply represent
lobulated renal cortex, however, a discrete cortical mass can not be
excluded on the presented images. There is no hydronephrosis. No
intrarenal calcifications are seen.

Left Kidney:

Renal measurements: 8.4 x 3.8 x 3.2 cm = volume: 53 mL. Mild
asymmetric renal cortical atrophy. Renal cortical echogenicity is
within normal limits. A 2.7 cm parapelvic cyst is seen within the
upper pole. A 14 mm nonobstructing calculus is seen within the
interpolar region of the left kidney. No renal mass. No
hydronephrosis.

Bladder:

Appears normal for degree of bladder distention.

Other:

None.
IMPRESSION: Mild bilateral renal cortical atrophy, right greater than left
resulting in lobulation of the right renal cortex.

Indeterminate masslike region within the interpolar region of the
right kidney. While I suspect this simply represents cortical
lobulation, dedicated CT or MRI imaging is recommended to exclude an
underlying cortical mass.

14 mm nonobstructing left renal calculus.

## 2022-03-27 ENCOUNTER — Other Ambulatory Visit: Payer: Self-pay | Admitting: Family Medicine

## 2022-06-06 ENCOUNTER — Ambulatory Visit (INDEPENDENT_AMBULATORY_CARE_PROVIDER_SITE_OTHER): Payer: Medicare Other | Admitting: Family Medicine

## 2022-06-06 VITALS — BP 120/78 | HR 52 | Ht 67.0 in | Wt 171.4 lb

## 2022-06-06 DIAGNOSIS — Z125 Encounter for screening for malignant neoplasm of prostate: Secondary | ICD-10-CM

## 2022-06-06 DIAGNOSIS — Z23 Encounter for immunization: Secondary | ICD-10-CM | POA: Diagnosis not present

## 2022-06-06 DIAGNOSIS — N1832 Chronic kidney disease, stage 3b: Secondary | ICD-10-CM | POA: Diagnosis not present

## 2022-06-06 DIAGNOSIS — Z0000 Encounter for general adult medical examination without abnormal findings: Secondary | ICD-10-CM

## 2022-06-06 DIAGNOSIS — Z136 Encounter for screening for cardiovascular disorders: Secondary | ICD-10-CM | POA: Diagnosis not present

## 2022-06-06 NOTE — Progress Notes (Signed)
Subjective:    Patient ID: David Lawson, male    DOB: 12-25-1950, 71 y.o.   MRN: [id number]  HPI Patient is a very pleasant 71 year old white male who comes in today for complete physical exam. He had a history of ureteral obstruction as a child that required surgery. Due to kidney damage he suffered as a child, he developed CKD as an adult.  This also caused  gout as an adult.  Patient's colonoscopy is up-to-date.  He is due for prostate cancer screening.  He is due for a flu shot, COVID booster, RSV, and a second dose of Shingrix.  Otherwise he is doing well with no concerns Immunization History  Administered Date(s) Administered   Fluad Quad(high Dose 65+) 05/09/2019   Influenza, High Dose Seasonal PF 07/06/2017, 06/21/2018   Influenza-Unspecified 08/03/2016, 07/04/2017   Moderna Sars-Covid-2 Vaccination 11/21/2019, 12/24/2019, 07/22/2020   PNEUMOCOCCAL CONJUGATE-20 03/21/2021   Pneumococcal Polysaccharide-23 06/09/2019   Tdap 03/12/2018, 04/03/2019     Past Medical History:  Diagnosis Date   Blood transfusion without reported diagnosis    as a child   CKD (chronic kidney disease) stage 3, GFR 30-59 ml/min (HCC)    DDD (degenerative disc disease)    Gout    Nephrolithiasis    PMR (polymyalgia rheumatica) (HCC)    Ureteral obstruction    Past Surgical History:  Procedure Laterality Date   bone spur     foot   COLONOSCOPY     HERNIA REPAIR     KNEE SURGERY Right    SHOULDER SURGERY Bilateral    TRIGGER FINGER RELEASE     x4   URETER SURGERY     x3   Current Outpatient Medications on File Prior to Visit  Medication Sig Dispense Refill   allopurinol (ZYLOPRIM) 100 MG tablet TAKE 2 TABLETS BY MOUTH DAILY. GENERIC EQUIVALENT FOR ZYLOPRIM 180 tablet 1   cephALEXin (KEFLEX) 500 MG capsule Take 1 capsule (500 mg total) by mouth 3 (three) times daily. 21 capsule 0   Colchicine (MITIGARE) 0.6 MG CAPS Take 0.6 mg by mouth daily. (Patient not taking: Reported on 06/21/2021) 30  capsule 3   glucosamine-chondroitin 500-400 MG tablet Take 1 tablet by mouth 3 (three) times daily.       Multiple Vitamins-Minerals (MULTIVITAMIN,TX-MINERALS) tablet Take 1 tablet by mouth daily.       niacin 500 MG tablet Take 500 mg by mouth 2 (two) times daily with a meal.     No current facility-administered medications on file prior to visit.   Allergies  Allergen Reactions   Sulfa Antibiotics     Unsure of reaction- as a child   Social History   Socioeconomic History   Marital status: Married    Spouse name: Not on file   Number of children: Not on file   Years of education: Not on file   Highest education level: Not on file  Occupational History   Not on file  Tobacco Use   Smoking status: Never   Smokeless tobacco: Never  Vaping Use   Vaping Use: Never used  Substance and Sexual Activity   Alcohol use: Not Currently   Drug use: No   Sexual activity: Not on file  Other Topics Concern   Not on file  Social History Narrative   Not on file   Social Determinants of Health   Financial Resource Strain: Not on file  Food Insecurity: Not on file  Transportation Needs: Not on file  Physical Activity:  Not on file  Stress: Not on file  Social Connections: Not on file  Intimate Partner Violence: Not on file   Family history significant for father with coronary artery disease and a family history of coronary artery disease on his father's side.    Review of Systems  All other systems reviewed and are negative.      Objective:   Physical Exam Vitals reviewed.  Constitutional:      General: He is not in acute distress.    Appearance: Normal appearance. He is normal weight. He is not ill-appearing, toxic-appearing or diaphoretic.  HENT:     Head: Normocephalic and atraumatic.     Right Ear: Tympanic membrane, ear canal and external ear normal. There is no impacted cerumen.     Left Ear: Tympanic membrane, ear canal and external ear normal. There is no impacted  cerumen.     Nose: Nose normal. No congestion.     Mouth/Throat:     Mouth: Mucous membranes are moist.     Pharynx: Oropharynx is clear. No oropharyngeal exudate.  Eyes:     Extraocular Movements: Extraocular movements intact.     Conjunctiva/sclera: Conjunctivae normal.     Pupils: Pupils are equal, round, and reactive to light.  Neck:     Vascular: No carotid bruit.  Cardiovascular:     Rate and Rhythm: Normal rate and regular rhythm.     Pulses: Normal pulses.     Heart sounds: Normal heart sounds. No murmur heard.    No friction rub. No gallop.  Pulmonary:     Effort: Pulmonary effort is normal. No respiratory distress.     Breath sounds: Normal breath sounds. No stridor. No wheezing, rhonchi or rales.  Abdominal:     General: Abdomen is flat. Bowel sounds are normal. There is no distension.     Tenderness: There is no abdominal tenderness. There is no guarding or rebound.  Musculoskeletal:     Cervical back: Normal range of motion and neck supple. No rigidity or tenderness.  Lymphadenopathy:     Cervical: No cervical adenopathy.  Skin:    General: Skin is warm.     Coloration: Skin is not jaundiced.     Findings: No bruising, erythema or lesion.  Neurological:     General: No focal deficit present.     Mental Status: He is alert and oriented to person, place, and time. Mental status is at baseline.     Cranial Nerves: No cranial nerve deficit.     Sensory: No sensory deficit.     Motor: No weakness.     Coordination: Coordination normal.     Gait: Gait normal.     Deep Tendon Reflexes: Reflexes normal.  Psychiatric:        Mood and Affect: Mood normal.        Behavior: Behavior normal.        Thought Content: Thought content normal.        Judgment: Judgment normal.           Assessment & Plan:  Stage 3b chronic kidney disease ([place]) - Plan: CBC with Differential/Platelet, COMPLETE METABOLIC PANEL WITH GFR, Lipid panel, Protein / Creatinine Ratio, Urine, Flu  vaccine HIGH DOSE PF (Fluzone High dose)  General medical exam - Plan: CBC with Differential/Platelet, COMPLETE METABOLIC PANEL WITH GFR, Lipid panel, PSA, Protein / Creatinine Ratio, Urine  Prostate cancer screening - Plan: PSA  Flu vaccine need - Plan: Flu vaccine HIGH DOSE PF (Fluzone  High dose) Check CBC CMP lipid panel and PSA.  Colonoscopy is up-to-date.  Check urine protein creatinine ratio.  If protein creatinine ratio is elevated greater than 300, I would recommend trying an angiotensin receptor blocker if his potassium will tolerate it.  I will also check a fasting lipid panel.  Goal LDL cholesterol is less than 100.  Patient received his flu shot today.  Recommended a COVID booster.  Recommended an RSV shot due to the fact he is caring for grandchildren less than a year of age.  Recommended second shingles vaccine.

## 2022-06-07 LAB — CBC WITH DIFFERENTIAL/PLATELET
Absolute Monocytes: 570 cells/uL (ref 200–950)
Basophils Absolute: 29 cells/uL (ref 0–200)
Basophils Relative: 0.5 %
Eosinophils Absolute: 160 cells/uL (ref 15–500)
Eosinophils Relative: 2.8 %
HCT: 39.9 % (ref 38.5–50.0)
Hemoglobin: 13.9 g/dL (ref 13.2–17.1)
Lymphs Abs: 1898 cells/uL (ref 850–3900)
MCH: 32.9 pg (ref 27.0–33.0)
MCHC: 34.8 g/dL (ref 32.0–36.0)
MCV: 94.5 fL (ref 80.0–100.0)
MPV: 10.3 fL (ref 7.5–12.5)
Monocytes Relative: 10 %
Neutro Abs: 3044 cells/uL (ref 1500–7800)
Neutrophils Relative %: 53.4 %
Platelets: 174 10*3/uL (ref 140–400)
RBC: 4.22 10*6/uL (ref 4.20–5.80)
RDW: 12.8 % (ref 11.0–15.0)
Total Lymphocyte: 33.3 %
WBC: 5.7 10*3/uL (ref 3.8–10.8)

## 2022-06-07 LAB — COMPLETE METABOLIC PANEL WITH GFR
AG Ratio: 1.9 (calc) (ref 1.0–2.5)
ALT: 14 U/L (ref 9–46)
AST: 20 U/L (ref 10–35)
Albumin: 4.2 g/dL (ref 3.6–5.1)
Alkaline phosphatase (APISO): 77 U/L (ref 35–144)
BUN/Creatinine Ratio: 16 (calc) (ref 6–22)
BUN: 35 mg/dL — ABNORMAL HIGH (ref 7–25)
CO2: 23 mmol/L (ref 20–32)
Calcium: 9.3 mg/dL (ref 8.6–10.3)
Chloride: 109 mmol/L (ref 98–110)
Creat: 2.16 mg/dL — ABNORMAL HIGH (ref 0.70–1.28)
Globulin: 2.2 g/dL (calc) (ref 1.9–3.7)
Glucose, Bld: 93 mg/dL (ref 65–99)
Potassium: 5 mmol/L (ref 3.5–5.3)
Sodium: 141 mmol/L (ref 135–146)
Total Bilirubin: 0.5 mg/dL (ref 0.2–1.2)
Total Protein: 6.4 g/dL (ref 6.1–8.1)
eGFR: 32 mL/min/{1.73_m2} — ABNORMAL LOW (ref >=60)

## 2022-06-07 LAB — LIPID PANEL
Cholesterol: 175 mg/dL (ref <200)
HDL: 43 mg/dL (ref >=40)
LDL Cholesterol (Calc): 113 mg/dL (calc) — ABNORMAL HIGH
Non-HDL Cholesterol (Calc): 132 mg/dL (calc) — ABNORMAL HIGH (ref <130)
Total CHOL/HDL Ratio: 4.1 (calc) (ref <5.0)
Triglycerides: 87 mg/dL (ref <150)

## 2022-06-07 LAB — PROTEIN / CREATININE RATIO, URINE
Creatinine, Urine: 74 mg/dL (ref 20–320)
Protein/Creat Ratio: 135 mg/g creat (ref 25–148)
Protein/Creatinine Ratio: 0.135 mg/mg creat (ref 0.025–0.148)
Total Protein, Urine: 10 mg/dL (ref 5–25)

## 2022-06-07 LAB — PSA: PSA: 3.36 ng/mL (ref <=4.00)

## 2022-09-05 ENCOUNTER — Other Ambulatory Visit: Payer: Self-pay

## 2022-09-05 DIAGNOSIS — M1A0610 Idiopathic chronic gout, right knee, without tophus (tophi): Secondary | ICD-10-CM

## 2022-09-05 DIAGNOSIS — M25561 Pain in right knee: Secondary | ICD-10-CM

## 2022-09-05 MED ORDER — ALLOPURINOL 100 MG PO TABS: ORAL_TABLET | ORAL | 1 refills | Status: DC

## 2022-11-01 DIAGNOSIS — N1832 Chronic kidney disease, stage 3b: Secondary | ICD-10-CM | POA: Diagnosis not present

## 2022-11-10 DIAGNOSIS — N200 Calculus of kidney: Secondary | ICD-10-CM | POA: Diagnosis not present

## 2022-11-10 DIAGNOSIS — E875 Hyperkalemia: Secondary | ICD-10-CM | POA: Diagnosis not present

## 2022-11-10 DIAGNOSIS — N1832 Chronic kidney disease, stage 3b: Secondary | ICD-10-CM | POA: Diagnosis not present

## 2022-11-10 DIAGNOSIS — M1000 Idiopathic gout, unspecified site: Secondary | ICD-10-CM | POA: Diagnosis not present

## 2022-11-10 LAB — LAB REPORT - SCANNED
Creatinine, POC: 102.9 mg/dL
EGFR: 29

## 2023-01-20 DIAGNOSIS — S0101XA Laceration without foreign body of scalp, initial encounter: Secondary | ICD-10-CM | POA: Diagnosis not present

## 2023-01-31 ENCOUNTER — Ambulatory Visit (INDEPENDENT_AMBULATORY_CARE_PROVIDER_SITE_OTHER): Payer: Medicare Other | Admitting: Family Medicine

## 2023-01-31 ENCOUNTER — Encounter: Payer: Self-pay | Admitting: Family Medicine

## 2023-01-31 VITALS — BP 124/72 | HR 53 | Temp 97.5°F | Ht 67.0 in | Wt 179.0 lb

## 2023-01-31 DIAGNOSIS — Z4802 Encounter for removal of sutures: Secondary | ICD-10-CM

## 2023-01-31 NOTE — Progress Notes (Signed)
Subjective:    Patient ID: David Lawson, male    DOB: [birth year], 72 y.o.   MRN: [id number]  HPI Patient suffered a laceration to his frontal scalp 10 days ago.  This was closed with 3 staples.  He is here today for staple removal.  The lacerations well-healed there is no evidence of anything ear infection  Past Medical History:  Diagnosis Date   Blood transfusion without reported diagnosis    as a child   CKD (chronic kidney disease) stage 3, GFR 30-59 ml/min (HCC)    DDD (degenerative disc disease)    Gout    Nephrolithiasis    PMR (polymyalgia rheumatica) (HCC)    Ureteral obstruction    Past Surgical History:  Procedure Laterality Date   bone spur     foot   COLONOSCOPY     HERNIA REPAIR     KNEE SURGERY Right    SHOULDER SURGERY Bilateral    TRIGGER FINGER RELEASE     x4   URETER SURGERY     x3   Current Outpatient Medications on File Prior to Visit  Medication Sig Dispense Refill   allopurinol (ZYLOPRIM) 100 MG tablet TAKE 2 TABLETS BY MOUTH DAILY. GENERIC EQUIVALENT FOR ZYLOPRIM 180 tablet 1   Colchicine (MITIGARE) 0.6 MG CAPS Take 0.6 mg by mouth daily. 30 capsule 3   glucosamine-chondroitin 500-400 MG tablet Take 1 tablet by mouth 3 (three) times daily.       Multiple Vitamins-Minerals (MULTIVITAMIN,TX-MINERALS) tablet Take 1 tablet by mouth daily.       niacin 500 MG tablet Take 500 mg by mouth 2 (two) times daily with a meal.     No current facility-administered medications on file prior to visit.   Allergies  Allergen Reactions   Sulfa Antibiotics     Unsure of reaction- as a child   Social History   Socioeconomic History   Marital status: Married    Spouse name: Not on file   Number of children: Not on file   Years of education: Not on file   Highest education level: Some college, no degree  Occupational History   Not on file  Tobacco Use   Smoking status: Never   Smokeless tobacco: Never  Vaping Use   Vaping Use: Never used  Substance and  Sexual Activity   Alcohol use: Not Currently   Drug use: No   Sexual activity: Not on file  Other Topics Concern   Not on file  Social History Narrative   Not on file   Social Determinants of Health   Financial Resource Strain: Low Risk  (01/27/2023)   Overall Financial Resource Strain (CARDIA)    Difficulty of Paying Living Expenses: Not hard at all  Food Insecurity: No Food Insecurity (01/27/2023)   Hunger Vital Sign    Worried About Running Out of Food in the Last Year: Never true    Ran Out of Food in the Last Year: Never true  Transportation Needs: No Transportation Needs (01/27/2023)   PRAPARE - Administrator, Civil Service (Medical): No    Lack of Transportation (Non-Medical): No  Physical Activity: Sufficiently Active (01/27/2023)   Exercise Vital Sign    Days of Exercise per Week: 5 days    Minutes of Exercise per Session: 150+ min  Stress: No Stress Concern Present (01/27/2023)   [employer] of Occupational Health - Occupational Stress Questionnaire    Feeling of Stress : Not at all  Social  Connections: Socially Integrated (01/27/2023)   Social Connection and Isolation Panel [NHANES]    Frequency of Communication with Friends and Family: Three times a week    Frequency of Social Gatherings with Friends and Family: More than three times a week    Attends Religious Services: More than 4 times per year    Active Member of [employer] or Organizations: Yes    Attends Engineer, structural: More than 4 times per year    Marital Status: Married  Catering manager Violence: Not on file   Review of Systems  All other systems reviewed and are negative.      Objective:   Physical Exam Vitals reviewed.  Constitutional:      General: He is not in acute distress.    Appearance: Normal appearance. He is normal weight. He is not ill-appearing, toxic-appearing or diaphoretic.  HENT:     Head: Normocephalic and atraumatic.      Right Ear: Tympanic membrane,  ear canal and external ear normal. There is no impacted cerumen.     Left Ear: Tympanic membrane, ear canal and external ear normal. There is no impacted cerumen.     Nose: Nose normal. No congestion.     Mouth/Throat:     Mouth: Mucous membranes are moist.     Pharynx: Oropharynx is clear. No oropharyngeal exudate.  Eyes:     Extraocular Movements: Extraocular movements intact.     Conjunctiva/sclera: Conjunctivae normal.     Pupils: Pupils are equal, round, and reactive to light.  Neck:     Vascular: No carotid bruit.  Cardiovascular:     Rate and Rhythm: Normal rate and regular rhythm.     Pulses: Normal pulses.     Heart sounds: Normal heart sounds. No murmur heard.    No friction rub. No gallop.  Pulmonary:     Effort: Pulmonary effort is normal. No respiratory distress.     Breath sounds: Normal breath sounds. No stridor. No wheezing, rhonchi or rales.  Abdominal:     General: Abdomen is flat. Bowel sounds are normal. There is no distension.     Tenderness: There is no abdominal tenderness. There is no guarding or rebound.  Musculoskeletal:     Cervical back: Normal range of motion and neck supple. No rigidity or tenderness.  Lymphadenopathy:     Cervical: No cervical adenopathy.  Skin:    General: Skin is warm.     Coloration: Skin is not jaundiced.     Findings: No bruising, erythema or lesion.  Neurological:     General: No focal deficit present.     Mental Status: He is alert and oriented to person, place, and time. Mental status is at baseline.     Cranial Nerves: No cranial nerve deficit.     Sensory: No sensory deficit.     Motor: No weakness.     Coordination: Coordination normal.     Gait: Gait normal.     Deep Tendon Reflexes: Reflexes normal.  Psychiatric:        Mood and Affect: Mood normal.        Behavior: Behavior normal.        Thought Content: Thought content normal.        Judgment: Judgment normal.           Assessment & Plan:  Encounter  for staple removal 3 staples removed without difficulty.  Patient tolerated the procedure well

## 2023-02-05 ENCOUNTER — Other Ambulatory Visit: Payer: Self-pay | Admitting: Family Medicine

## 2023-02-05 DIAGNOSIS — M1A0610 Idiopathic chronic gout, right knee, without tophus (tophi): Secondary | ICD-10-CM

## 2023-02-05 DIAGNOSIS — M25561 Pain in right knee: Secondary | ICD-10-CM

## 2023-02-07 DIAGNOSIS — N1832 Chronic kidney disease, stage 3b: Secondary | ICD-10-CM | POA: Diagnosis not present

## 2023-02-13 DIAGNOSIS — N200 Calculus of kidney: Secondary | ICD-10-CM | POA: Diagnosis not present

## 2023-02-13 DIAGNOSIS — M1000 Idiopathic gout, unspecified site: Secondary | ICD-10-CM | POA: Diagnosis not present

## 2023-02-13 DIAGNOSIS — N1832 Chronic kidney disease, stage 3b: Secondary | ICD-10-CM | POA: Diagnosis not present

## 2023-02-13 DIAGNOSIS — E875 Hyperkalemia: Secondary | ICD-10-CM | POA: Diagnosis not present

## 2023-05-25 DIAGNOSIS — M25551 Pain in right hip: Secondary | ICD-10-CM | POA: Diagnosis not present

## 2023-06-06 ENCOUNTER — Other Ambulatory Visit: Payer: Medicare Other

## 2023-06-08 ENCOUNTER — Other Ambulatory Visit: Payer: Medicare Other

## 2023-06-12 ENCOUNTER — Ambulatory Visit: Payer: Medicare Other | Admitting: Family Medicine

## 2023-06-12 ENCOUNTER — Ambulatory Visit (INDEPENDENT_AMBULATORY_CARE_PROVIDER_SITE_OTHER): Payer: Medicare Other | Admitting: Family Medicine

## 2023-06-12 ENCOUNTER — Encounter: Payer: Self-pay | Admitting: Family Medicine

## 2023-06-12 VITALS — BP 120/80 | HR 56 | Temp 97.7°F | Ht 67.0 in | Wt 176.4 lb

## 2023-06-12 DIAGNOSIS — Z0001 Encounter for general adult medical examination with abnormal findings: Secondary | ICD-10-CM | POA: Diagnosis not present

## 2023-06-12 DIAGNOSIS — N1832 Chronic kidney disease, stage 3b: Secondary | ICD-10-CM | POA: Diagnosis not present

## 2023-06-12 DIAGNOSIS — Z125 Encounter for screening for malignant neoplasm of prostate: Secondary | ICD-10-CM

## 2023-06-12 DIAGNOSIS — Z0000 Encounter for general adult medical examination without abnormal findings: Secondary | ICD-10-CM

## 2023-06-12 NOTE — Progress Notes (Signed)
Subjective:    Patient ID: David Lawson, male    DOB: Jan 12, 1951, 72 y.o.   MRN: [id number]  HPI Patient is a very pleasant 72 year old white male who comes in today for complete physical exam. He had a history of ureteral obstruction as a child that required surgery. Due to kidney damage he suffered as a child, he developed CKD as an adult.  This also caused  gout as an adult.  Patient's colonoscopy is up-to-date.  He is due for prostate cancer screening.  The patient has already had his flu shot and COVID shot.  We discussed the risk and benefits of an RSV vaccine.  I recommended this to him because of his new grandchildren.  Otherwise he is doing well.  He denies any memory loss.  He denies any depression.  He denies any dizziness or falls.  He does complain of right posterior hip pain and has a history of degenerative joint disease Immunization History  Administered Date(s) Administered   Fluad Quad(high Dose 65+) 05/09/2019   Influenza, High Dose Seasonal PF 07/06/2017, 06/21/2018, 06/06/2022   Influenza-Unspecified 08/03/2016, 07/04/2017   Moderna Sars-Covid-2 Vaccination 11/21/2019, 12/24/2019, 07/22/2020   PNEUMOCOCCAL CONJUGATE-20 03/21/2021   Pneumococcal Polysaccharide-23 06/09/2019   Tdap 03/12/2018, 04/03/2019     Past Medical History:  Diagnosis Date   Blood transfusion without reported diagnosis    as a child   CKD (chronic kidney disease) stage 3, GFR 30-59 ml/min (HCC)    DDD (degenerative disc disease)    Gout    Nephrolithiasis    PMR (polymyalgia rheumatica) (HCC)    Ureteral obstruction    Past Surgical History:  Procedure Laterality Date   bone spur     foot   COLONOSCOPY     HERNIA REPAIR     KNEE SURGERY Right    SHOULDER SURGERY Bilateral    TRIGGER FINGER RELEASE     x4   URETER SURGERY     x3   Current Outpatient Medications on File Prior to Visit  Medication Sig Dispense Refill   allopurinol (ZYLOPRIM) 100 MG tablet TAKE 2 TABLETS BY MOUTH  DAILY, GENERIC EQUIVALENT FOR ZYLOPRIM 180 tablet 1   Colchicine (MITIGARE) 0.6 MG CAPS Take 0.6 mg by mouth daily. 30 capsule 3   glucosamine-chondroitin 500-400 MG tablet Take 1 tablet by mouth 3 (three) times daily.       Multiple Vitamins-Minerals (MULTIVITAMIN,TX-MINERALS) tablet Take 1 tablet by mouth daily.       niacin 500 MG tablet Take 500 mg by mouth 2 (two) times daily with a meal.     No current facility-administered medications on file prior to visit.   Allergies  Allergen Reactions   Sulfa Antibiotics     Unsure of reaction- as a child   Social History   Socioeconomic History   Marital status: Married    Spouse name: Not on file   Number of children: Not on file   Years of education: Not on file   Highest education level: Some college, no degree  Occupational History   Not on file  Tobacco Use   Smoking status: Never   Smokeless tobacco: Never  Vaping Use   Vaping status: Never Used  Substance and Sexual Activity   Alcohol use: Not Currently   Drug use: No   Sexual activity: Not on file  Other Topics Concern   Not on file  Social History Narrative   Not on file   Social Determinants of Health  Financial Resource Strain: Low Risk  (01/27/2023)   Overall Financial Resource Strain (CARDIA)    Difficulty of Paying Living Expenses: Not hard at all  Food Insecurity: No Food Insecurity (01/27/2023)   Hunger Vital Sign    Worried About Running Out of Food in the Last Year: Never true    Ran Out of Food in the Last Year: Never true  Transportation Needs: No Transportation Needs (01/27/2023)   PRAPARE - Administrator, Civil Service (Medical): No    Lack of Transportation (Non-Medical): No  Physical Activity: Sufficiently Active (01/27/2023)   Exercise Vital Sign    Days of Exercise per Week: 5 days    Minutes of Exercise per Session: 150+ min  Stress: No Stress Concern Present (01/27/2023)   [employer] of Occupational Health - Occupational  Stress Questionnaire    Feeling of Stress : Not at all  Social Connections: Socially Integrated (01/27/2023)   Social Connection and Isolation Panel [NHANES]    Frequency of Communication with Friends and Family: Three times a week    Frequency of Social Gatherings with Friends and Family: More than three times a week    Attends Religious Services: More than 4 times per year    Active Member of [employer] or Organizations: Yes    Attends Engineer, structural: More than 4 times per year    Marital Status: Married  Catering manager Violence: Not on file   Family history significant for father with coronary artery disease and a family history of coronary artery disease on his father's side.    Review of Systems  All other systems reviewed and are negative.      Objective:   Physical Exam Vitals reviewed.  Constitutional:      General: He is not in acute distress.    Appearance: Normal appearance. He is normal weight. He is not ill-appearing, toxic-appearing or diaphoretic.  HENT:     Head: Normocephalic and atraumatic.     Right Ear: Tympanic membrane, ear canal and external ear normal. There is no impacted cerumen.     Left Ear: Tympanic membrane, ear canal and external ear normal. There is no impacted cerumen.     Nose: Nose normal. No congestion.     Mouth/Throat:     Mouth: Mucous membranes are moist.     Pharynx: Oropharynx is clear. No oropharyngeal exudate.  Eyes:     Extraocular Movements: Extraocular movements intact.     Conjunctiva/sclera: Conjunctivae normal.     Pupils: Pupils are equal, round, and reactive to light.  Neck:     Vascular: No carotid bruit.  Cardiovascular:     Rate and Rhythm: Normal rate and regular rhythm.     Pulses: Normal pulses.     Heart sounds: Normal heart sounds. No murmur heard.    No friction rub. No gallop.  Pulmonary:     Effort: Pulmonary effort is normal. No respiratory distress.     Breath sounds: Normal breath sounds. No  stridor. No wheezing, rhonchi or rales.  Abdominal:     General: Abdomen is flat. Bowel sounds are normal. There is no distension.     Tenderness: There is no abdominal tenderness. There is no guarding or rebound.  Musculoskeletal:     Cervical back: Normal range of motion and neck supple. No rigidity or tenderness.  Lymphadenopathy:     Cervical: No cervical adenopathy.  Skin:    General: Skin is warm.     Coloration:  Skin is not jaundiced.     Findings: No bruising, erythema or lesion.  Neurological:     General: No focal deficit present.     Mental Status: He is alert and oriented to person, place, and time. Mental status is at baseline.     Cranial Nerves: No cranial nerve deficit.     Sensory: No sensory deficit.     Motor: No weakness.     Coordination: Coordination normal.     Gait: Gait normal.     Deep Tendon Reflexes: Reflexes normal.  Psychiatric:        Mood and Affect: Mood normal.        Behavior: Behavior normal.        Thought Content: Thought content normal.        Judgment: Judgment normal.           Assessment & Plan:  Stage 3b chronic kidney disease (HCC) - Plan: CBC with Differential/Platelet, COMPLETE METABOLIC PANEL WITH GFR, Lipid panel  Prostate cancer screening - Plan: PSA  General medical exam Check CBC CMP lipid panel and PSA.  Colonoscopy is up-to-date.  Recommended an RSV vaccine.  Blood pressure today is excellent.  Patient is seeing nephrology for regular follow-ups.  No evidence of dementia, falls, or depression on exam.  Regular anticipatory guidance is provided.  Goal LDL cholesterol is less than 100.

## 2023-06-13 LAB — CBC WITH DIFFERENTIAL/PLATELET
Absolute Monocytes: 588 {cells}/uL (ref 200–950)
Basophils Absolute: 29 {cells}/uL (ref 0–200)
Basophils Relative: 0.6 %
Eosinophils Absolute: 260 {cells}/uL (ref 15–500)
Eosinophils Relative: 5.3 %
HCT: 40.5 % (ref 38.5–50.0)
Hemoglobin: 13.2 g/dL (ref 13.2–17.1)
Lymphs Abs: 1622 {cells}/uL (ref 850–3900)
MCH: 31.7 pg (ref 27.0–33.0)
MCHC: 32.6 g/dL (ref 32.0–36.0)
MCV: 97.4 fL (ref 80.0–100.0)
MPV: 10.3 fL (ref 7.5–12.5)
Monocytes Relative: 12 %
Neutro Abs: 2401 {cells}/uL (ref 1500–7800)
Neutrophils Relative %: 49 %
Platelets: 161 10*3/uL (ref 140–400)
RBC: 4.16 10*6/uL — ABNORMAL LOW (ref 4.20–5.80)
RDW: 12.8 % (ref 11.0–15.0)
Total Lymphocyte: 33.1 %
WBC: 4.9 10*3/uL (ref 3.8–10.8)

## 2023-06-13 LAB — COMPLETE METABOLIC PANEL WITH GFR
AG Ratio: 2 (calc) (ref 1.0–2.5)
ALT: 19 U/L (ref 9–46)
AST: 27 U/L (ref 10–35)
Albumin: 4.2 g/dL (ref 3.6–5.1)
Alkaline phosphatase (APISO): 86 U/L (ref 35–144)
BUN/Creatinine Ratio: 16 (calc) (ref 6–22)
BUN: 37 mg/dL — ABNORMAL HIGH (ref 7–25)
CO2: 24 mmol/L (ref 20–32)
Calcium: 9.5 mg/dL (ref 8.6–10.3)
Chloride: 111 mmol/L — ABNORMAL HIGH (ref 98–110)
Creat: 2.27 mg/dL — ABNORMAL HIGH (ref 0.70–1.28)
Globulin: 2.1 g/dL (ref 1.9–3.7)
Glucose, Bld: 91 mg/dL (ref 65–99)
Potassium: 4.4 mmol/L (ref 3.5–5.3)
Sodium: 143 mmol/L (ref 135–146)
Total Bilirubin: 0.7 mg/dL (ref 0.2–1.2)
Total Protein: 6.3 g/dL (ref 6.1–8.1)
eGFR: 30 mL/min/{1.73_m2} — ABNORMAL LOW (ref >=60)

## 2023-06-13 LAB — LIPID PANEL
Cholesterol: 172 mg/dL (ref <200)
HDL: 42 mg/dL (ref >=40)
LDL Cholesterol (Calc): 112 mg/dL — ABNORMAL HIGH
Non-HDL Cholesterol (Calc): 130 mg/dL — ABNORMAL HIGH (ref <130)
Total CHOL/HDL Ratio: 4.1 (calc) (ref <5.0)
Triglycerides: 79 mg/dL (ref <150)

## 2023-06-13 LAB — PSA: PSA: 1.54 ng/mL (ref <=4.00)

## 2023-08-07 DIAGNOSIS — N1832 Chronic kidney disease, stage 3b: Secondary | ICD-10-CM | POA: Diagnosis not present

## 2023-08-13 DIAGNOSIS — N1832 Chronic kidney disease, stage 3b: Secondary | ICD-10-CM | POA: Diagnosis not present

## 2023-08-13 DIAGNOSIS — N200 Calculus of kidney: Secondary | ICD-10-CM | POA: Diagnosis not present

## 2023-08-13 DIAGNOSIS — M1000 Idiopathic gout, unspecified site: Secondary | ICD-10-CM | POA: Diagnosis not present

## 2023-08-13 DIAGNOSIS — E875 Hyperkalemia: Secondary | ICD-10-CM | POA: Diagnosis not present

## 2023-09-10 ENCOUNTER — Other Ambulatory Visit: Payer: Self-pay | Admitting: Family Medicine

## 2023-09-10 DIAGNOSIS — M1A0610 Idiopathic chronic gout, right knee, without tophus (tophi): Secondary | ICD-10-CM

## 2023-09-10 DIAGNOSIS — M25561 Pain in right knee: Secondary | ICD-10-CM

## 2024-01-30 DIAGNOSIS — N1832 Chronic kidney disease, stage 3b: Secondary | ICD-10-CM | POA: Diagnosis not present

## 2024-02-05 DIAGNOSIS — M1000 Idiopathic gout, unspecified site: Secondary | ICD-10-CM | POA: Diagnosis not present

## 2024-02-05 DIAGNOSIS — N200 Calculus of kidney: Secondary | ICD-10-CM | POA: Diagnosis not present

## 2024-02-05 DIAGNOSIS — M1990 Unspecified osteoarthritis, unspecified site: Secondary | ICD-10-CM | POA: Diagnosis not present

## 2024-02-05 DIAGNOSIS — N1832 Chronic kidney disease, stage 3b: Secondary | ICD-10-CM | POA: Diagnosis not present

## 2024-02-08 ENCOUNTER — Other Ambulatory Visit: Payer: Self-pay | Admitting: Family Medicine

## 2024-02-08 MED ORDER — CIPROFLOXACIN HCL 250 MG PO TABS: 250.0000 mg | ORAL_TABLET | Freq: Two times a day (BID) | ORAL | 0 refills | Status: AC

## 2024-02-11 ENCOUNTER — Encounter: Payer: Self-pay | Admitting: Family Medicine

## 2024-02-11 ENCOUNTER — Ambulatory Visit: Admitting: Family Medicine

## 2024-02-11 VITALS — BP 115/70 | HR 65 | Temp 97.7°F | Ht 67.0 in | Wt 175.6 lb

## 2024-02-11 DIAGNOSIS — N410 Acute prostatitis: Secondary | ICD-10-CM

## 2024-02-11 DIAGNOSIS — R399 Unspecified symptoms and signs involving the genitourinary system: Secondary | ICD-10-CM | POA: Diagnosis not present

## 2024-02-11 LAB — URINALYSIS, ROUTINE W REFLEX MICROSCOPIC
Bacteria, UA: NONE SEEN /HPF
Bilirubin Urine: NEGATIVE
Glucose, UA: NEGATIVE
Hyaline Cast: NONE SEEN /LPF
Ketones, ur: NEGATIVE
Leukocytes,Ua: NEGATIVE
Nitrite: NEGATIVE
Protein, ur: NEGATIVE
Specific Gravity, Urine: 1.007 (ref 1.001–1.035)
Squamous Epithelial / HPF: NONE SEEN /HPF (ref <=5)
WBC, UA: NONE SEEN /HPF (ref 0–5)
pH: 6 (ref 5.0–8.0)

## 2024-02-11 LAB — MICROSCOPIC MESSAGE

## 2024-02-11 MED ORDER — CEPHALEXIN 500 MG PO CAPS: 500.0000 mg | ORAL_CAPSULE | Freq: Four times a day (QID) | ORAL | 0 refills | Status: AC

## 2024-02-11 MED ORDER — CEFTRIAXONE SODIUM 500 MG IJ SOLR
500.0000 mg | Freq: Once | INTRAMUSCULAR | Status: AC
  Administered 2024-02-11: 500 mg via INTRAMUSCULAR

## 2024-02-11 MED ORDER — CEFTRIAXONE SODIUM 500 MG IJ SOLR
500.0000 mg | Freq: Once | INTRAMUSCULAR | Status: AC
Start: 2024-02-11 — End: 2024-02-11
  Administered 2024-02-11: 500 mg via INTRAMUSCULAR

## 2024-02-11 MED ORDER — TAMSULOSIN HCL 0.4 MG PO CAPS: 0.4000 mg | ORAL_CAPSULE | Freq: Every day | ORAL | 3 refills | Status: AC

## 2024-02-11 NOTE — Addendum Note (Signed)
 Addended by: Nonnie Beagle R on: 02/11/2024 10:52 AM   Modules accepted: Orders

## 2024-02-11 NOTE — Progress Notes (Signed)
 Subjective:    Patient ID: David Lawson, male    DOB: [birth year], 73 y.o.   MRN: [id number]  HPI Patient is a very pleasant 73 year old white male who has a history of ureteral obstruction as a child that required surgery. Due to kidney damage he suffered as a child, he developed CKD as an adult.  Patient developed urinary frequency and urgency on Wednesday of last week.  He had mild dysuria.  He started Cipro  that he had at home.  He was also having fevers.  The fevers have subsided but he continues to have intense frequency and urgency 7-8 times each night.  He does not feel like he is able to empty his bladder completely.  Urinalysis on Cipro  showed trace blood but is otherwise negative.  He has no CVA tenderness.    Past Medical History:  Diagnosis Date   Blood transfusion without reported diagnosis    as a child   CKD (chronic kidney disease) stage 3, GFR 30-59 ml/min (HCC)    DDD (degenerative disc disease)    Gout    Nephrolithiasis    PMR (polymyalgia rheumatica) (HCC)    Ureteral obstruction    Past Surgical History:  Procedure Laterality Date   bone spur     foot   COLONOSCOPY     HERNIA REPAIR     KNEE SURGERY Right    SHOULDER SURGERY Bilateral    TRIGGER FINGER RELEASE     x4   URETER SURGERY     x3   Current Outpatient Medications on File Prior to Visit  Medication Sig Dispense Refill   allopurinol  (ZYLOPRIM ) 100 MG tablet TAKE 2 TABLETS BY MOUTH DAILY GENERIC EQUIVALENT FOR ZYLOPRIM  180 tablet 1   ciprofloxacin  (CIPRO ) 250 MG tablet Take 1 tablet (250 mg total) by mouth 2 (two) times daily for 7 days. 14 tablet 0   Colchicine  (MITIGARE ) 0.6 MG CAPS Take 0.6 mg by mouth daily. 30 capsule 3   glucosamine-chondroitin 500-400 MG tablet Take 1 tablet by mouth 3 (three) times daily.       Multiple Vitamins-Minerals (MULTIVITAMIN,TX-MINERALS) tablet Take 1 tablet by mouth daily.       niacin 500 MG tablet Take 500 mg by mouth 2 (two) times daily with a meal.     No  current facility-administered medications on file prior to visit.   Allergies  Allergen Reactions   Sulfa Antibiotics     Unsure of reaction- as a child   Social History   Socioeconomic History   Marital status: Married    Spouse name: Not on file   Number of children: Not on file   Years of education: Not on file   Highest education level: Some college, no degree  Occupational History   Not on file  Tobacco Use   Smoking status: Never   Smokeless tobacco: Never  Vaping Use   Vaping status: Never Used  Substance and Sexual Activity   Alcohol use: Not Currently   Drug use: No   Sexual activity: Not on file  Other Topics Concern   Not on file  Social History Narrative   Not on file   Social Drivers of Health   Financial Resource Strain: Low Risk  (01/27/2023)   Overall Financial Resource Strain (CARDIA)    Difficulty of Paying Living Expenses: Not hard at all  Food Insecurity: No Food Insecurity (01/27/2023)   Hunger Vital Sign    Worried About Running Out of Food in the  Last Year: Never true    Ran Out of Food in the Last Year: Never true  Transportation Needs: No Transportation Needs (01/27/2023)   PRAPARE - Administrator, Civil Service (Medical): No    Lack of Transportation (Non-Medical): No  Physical Activity: Sufficiently Active (01/27/2023)   Exercise Vital Sign    Days of Exercise per Week: 5 days    Minutes of Exercise per Session: 150+ min  Stress: No Stress Concern Present (01/27/2023)   [employer] of Occupational Health - Occupational Stress Questionnaire    Feeling of Stress : Not at all  Social Connections: Socially Integrated (01/27/2023)   Social Connection and Isolation Panel [NHANES]    Frequency of Communication with Friends and Family: Three times a week    Frequency of Social Gatherings with Friends and Family: More than three times a week    Attends Religious Services: More than 4 times per year    Active Member of [employer] or  Organizations: Yes    Attends Engineer, structural: More than 4 times per year    Marital Status: Married  Catering manager Violence: Not on file   Review of Systems  All other systems reviewed and are negative.      Objective:   Physical Exam Vitals reviewed.  Constitutional:      General: He is not in acute distress.    Appearance: Normal appearance. He is normal weight. He is not ill-appearing, toxic-appearing or diaphoretic.  HENT:     Head: Normocephalic and atraumatic.     Right Ear: External ear normal. There is no impacted cerumen.     Left Ear: External ear normal. There is no impacted cerumen.     Nose: Nose normal. No congestion.     Mouth/Throat:     Mouth: Mucous membranes are moist.     Pharynx: Oropharynx is clear. No oropharyngeal exudate.  Eyes:     Extraocular Movements: Extraocular movements intact.     Conjunctiva/sclera: Conjunctivae normal.     Pupils: Pupils are equal, round, and reactive to light.  Neck:     Vascular: No carotid bruit.  Cardiovascular:     Rate and Rhythm: Normal rate and regular rhythm.     Pulses: Normal pulses.     Heart sounds: Normal heart sounds. No murmur heard.    No friction rub. No gallop.  Pulmonary:     Effort: Pulmonary effort is normal. No respiratory distress.     Breath sounds: Normal breath sounds. No stridor. No wheezing, rhonchi or rales.  Abdominal:     General: Abdomen is flat. Bowel sounds are normal. There is no distension.     Tenderness: There is no abdominal tenderness. There is no guarding or rebound.  Genitourinary:    Prostate: Enlarged and tender.  Musculoskeletal:     Cervical back: Normal range of motion and neck supple. No rigidity or tenderness.  Lymphadenopathy:     Cervical: No cervical adenopathy.  Skin:    General: Skin is warm.     Coloration: Skin is not jaundiced.     Findings: No bruising, erythema or lesion.  Neurological:     General: No focal deficit present.     Mental  Status: He is alert and oriented to person, place, and time. Mental status is at baseline.     Cranial Nerves: No cranial nerve deficit.     Sensory: No sensory deficit.     Motor: No weakness.  Coordination: Coordination normal.     Gait: Gait normal.     Deep Tendon Reflexes: Reflexes normal.  Psychiatric:        Mood and Affect: Mood normal.        Behavior: Behavior normal.        Thought Content: Thought content normal.        Judgment: Judgment normal.           Assessment & Plan:  UTI symptoms - Plan: Urinalysis, Urine Culture, Urinalysis, Routine w reflex microscopic  Acute prostatitis Patient has acute prostatitis despite taking Cipro  raising the concern about possible resistance.  Given 1 g of Rocephin .  Start Keflex  500 4 times daily for 7 days.  Add Flomax 0.4 mg nightly.  Recheck if no better in 1 week or sooner if worse.

## 2024-02-12 LAB — URINE CULTURE
MICRO NUMBER:: 16555504
Result:: NO GROWTH
SPECIMEN QUALITY:: ADEQUATE

## 2024-03-19 ENCOUNTER — Ambulatory Visit

## 2024-03-21 ENCOUNTER — Encounter: Payer: Self-pay | Admitting: Advanced Practice Midwife

## 2024-03-28 ENCOUNTER — Other Ambulatory Visit: Payer: Self-pay

## 2024-03-28 ENCOUNTER — Telehealth: Payer: Self-pay | Admitting: Family Medicine

## 2024-03-28 DIAGNOSIS — M25561 Pain in right knee: Secondary | ICD-10-CM

## 2024-03-28 DIAGNOSIS — M1A0610 Idiopathic chronic gout, right knee, without tophus (tophi): Secondary | ICD-10-CM

## 2024-03-28 MED ORDER — ALLOPURINOL 100 MG PO TABS: ORAL_TABLET | ORAL | 1 refills | Status: DC

## 2024-03-28 NOTE — Telephone Encounter (Signed)
 Prescription Request  03/28/2024  LOV: 02/11/2024  What is the name of the medication or equipment?   allopurinol  (ZYLOPRIM ) 100 MG tablet   Have you contacted your pharmacy to request a refill? Yes   Which pharmacy would you like this sent to?  [facility] - [place], [place] - [address] [place] [id number] Phone: [phone] Fax: [phone]    Patient notified that their request is being sent to the clinical staff for review and that they should receive a response within 2 business days.   Please advise pharmacist.

## 2024-05-01 ENCOUNTER — Ambulatory Visit (INDEPENDENT_AMBULATORY_CARE_PROVIDER_SITE_OTHER): Admitting: *Deleted

## 2024-05-01 VITALS — Ht 67.0 in | Wt 175.0 lb

## 2024-05-01 DIAGNOSIS — Z0000 Encounter for general adult medical examination without abnormal findings: Secondary | ICD-10-CM | POA: Diagnosis not present

## 2024-05-01 NOTE — Patient Instructions (Signed)
 David Lawson , Thank you for taking time to come for your Medicare Wellness Visit. I appreciate your ongoing commitment to your health goals. Please review the following plan we discussed and let me know if I can assist you in the future.   Screening recommendations/referrals: Colonoscopy: up to date Recommended yearly ophthalmology/optometry visit for glaucoma screening and checkup Recommended yearly dental visit for hygiene and checkup  Vaccinations: Influenza vaccine: Education provided Pneumococcal vaccine: up to date Tdap vaccine: up to date Shingles vaccine: up to date       Preventive Care 65 Years and Older, Male Preventive care refers to lifestyle choices and visits with your health care provider that can promote health and wellness. What does preventive care include? A yearly physical exam. This is also called an annual well check. Dental exams once or twice a year. Routine eye exams. Ask your health care provider how often you should have your eyes checked. Personal lifestyle choices, including: Daily care of your teeth and gums. Regular physical activity. Eating a healthy diet. Avoiding tobacco and drug use. Limiting alcohol use. Practicing safe sex. Taking low doses of aspirin  every day. Taking vitamin and mineral supplements as recommended by your health care provider. What happens during an annual well check? The services and screenings done by your health care provider during your annual well check will depend on your age, overall health, lifestyle risk factors, and family history of disease. Counseling  Your health care provider may ask you questions about your: Alcohol use. Tobacco use. Drug use. Emotional well-being. Home and relationship well-being. Sexual activity. Eating habits. History of falls. Memory and ability to understand (cognition). Work and work Astronomer. Screening  You may have the following tests or measurements: Height, weight, and  BMI. Blood pressure. Lipid and cholesterol levels. These may be checked every 5 years, or more frequently if you are over 72 years old. Skin check. Lung cancer screening. You may have this screening every year starting at age 72 if you have a 30-pack-year history of smoking and currently smoke or have quit within the past 15 years. Fecal occult blood test (FOBT) of the stool. You may have this test every year starting at age 72. Flexible sigmoidoscopy or colonoscopy. You may have a sigmoidoscopy every 5 years or a colonoscopy every 10 years starting at age 72. Prostate cancer screening. Recommendations will vary depending on your family history and other risks. Hepatitis C blood test. Hepatitis B blood test. Sexually transmitted disease (STD) testing. Diabetes screening. This is done by checking your blood sugar (glucose) after you have not eaten for a while (fasting). You may have this done every 1-3 years. Abdominal aortic aneurysm (AAA) screening. You may need this if you are a current or former smoker. Osteoporosis. You may be screened starting at age 36 if you are at high risk. Talk with your health care provider about your test results, treatment options, and if necessary, the need for more tests. Vaccines  Your health care provider may recommend certain vaccines, such as: Influenza vaccine. This is recommended every year. Tetanus, diphtheria, and acellular pertussis (Tdap, Td) vaccine. You may need a Td booster every 10 years. Zoster vaccine. You may need this after age 91. Pneumococcal 13-valent conjugate (PCV13) vaccine. One dose is recommended after age 15. Pneumococcal polysaccharide (PPSV23) vaccine. One dose is recommended after age 85. Talk to your health care provider about which screenings and vaccines you need and how often you need them. This information is not intended  to replace advice given to you by your health care provider. Make sure you discuss any questions you have  with your health care provider. Document Released: 09/17/2015 Document Revised: 05/10/2016 Document Reviewed: 06/22/2015 Elsevier Interactive Patient Education  2017 [employer].  Fall Prevention in the Home Falls can cause injuries. They can happen to people of all ages. There are many things you can do to make your home safe and to help prevent falls. What can I do on the outside of my home? Regularly fix the edges of walkways and driveways and fix any cracks. Remove anything that might make you trip as you walk through a door, such as a raised step or threshold. Trim any bushes or trees on the path to your home. Use bright outdoor lighting. Clear any walking paths of anything that might make someone trip, such as rocks or tools. Regularly check to see if handrails are loose or broken. Make sure that both sides of any steps have handrails. Any raised decks and porches should have guardrails on the edges. Have any leaves, snow, or ice cleared regularly. Use sand or salt on walking paths during winter. Clean up any spills in your garage right away. This includes oil or grease spills. What can I do in the bathroom? Use night lights. Install grab bars by the toilet and in the tub and shower. Do not use towel bars as grab bars. Use non-skid mats or decals in the tub or shower. If you need to sit down in the shower, use a plastic, non-slip stool. Keep the floor dry. Clean up any water that spills on the floor as soon as it happens. Remove soap buildup in the tub or shower regularly. Attach bath mats securely with double-sided non-slip rug tape. Do not have throw rugs and other things on the floor that can make you trip. What can I do in the bedroom? Use night lights. Make sure that you have a light by your bed that is easy to reach. Do not use any sheets or blankets that are too big for your bed. They should not hang down onto the floor. Have a firm chair that has side arms. You can use  this for support while you get dressed. Do not have throw rugs and other things on the floor that can make you trip. What can I do in the kitchen? Clean up any spills right away. Avoid walking on wet floors. Keep items that you use a lot in easy-to-reach places. If you need to reach something above you, use a strong step stool that has a grab bar. Keep electrical cords out of the way. Do not use floor polish or wax that makes floors slippery. If you must use wax, use non-skid floor wax. Do not have throw rugs and other things on the floor that can make you trip. What can I do with my stairs? Do not leave any items on the stairs. Make sure that there are handrails on both sides of the stairs and use them. Fix handrails that are broken or loose. Make sure that handrails are as long as the stairways. Check any carpeting to make sure that it is firmly attached to the stairs. Fix any carpet that is loose or worn. Avoid having throw rugs at the top or bottom of the stairs. If you do have throw rugs, attach them to the floor with carpet tape. Make sure that you have a light switch at the top of the stairs and  the bottom of the stairs. If you do not have them, ask someone to add them for you. What else can I do to help prevent falls? Wear shoes that: Do not have high heels. Have rubber bottoms. Are comfortable and fit you well. Are closed at the toe. Do not wear sandals. If you use a stepladder: Make sure that it is fully opened. Do not climb a closed stepladder. Make sure that both sides of the stepladder are locked into place. Ask someone to hold it for you, if possible. Clearly mark and make sure that you can see: Any grab bars or handrails. First and last steps. Where the edge of each step is. Use tools that help you move around (mobility aids) if they are needed. These include: Canes. Walkers. Scooters. Crutches. Turn on the lights when you go into a dark area. Replace any light bulbs  as soon as they burn out. Set up your furniture so you have a clear path. Avoid moving your furniture around. If any of your floors are uneven, fix them. If there are any pets around you, be aware of where they are. Review your medicines with your doctor. Some medicines can make you feel dizzy. This can increase your chance of falling. Ask your doctor what other things that you can do to help prevent falls. This information is not intended to replace advice given to you by your health care provider. Make sure you discuss any questions you have with your health care provider. Document Released: 06/17/2009 Document Revised: 01/27/2016 Document Reviewed: 09/25/2014 Elsevier Interactive Patient Education  2017 [employer].

## 2024-05-01 NOTE — Progress Notes (Signed)
 Subjective:   David Lawson is a 73 y.o. male who presents for Medicare Annual/Subsequent preventive examination.  Visit Complete: Virtual I connected with  David Lawson on 05/01/24 by a audio enabled telemedicine application and verified that I am speaking with the correct person using two identifiers.  Patient Location: Home  Provider Location: Home Office  I discussed the limitations of evaluation and management by telemedicine. The patient expressed understanding and agreed to proceed.  Vital Signs: Because this visit was a virtual/telehealth visit, some criteria may be missing or patient reported. Any vitals not documented were not able to be obtained and vitals that have been documented are patient reported.   Cardiac Risk Factors include: advanced age (>20men, >28 women);male gender;obesity (BMI >30kg/m2)     Objective:    Today's Vitals   05/01/24 0902  Weight: 175 lb (79.4 kg)  Height: 5' 7 (1.702 m)  PainSc: 5    Body mass index is 27.41 kg/m.     05/01/2024    9:04 AM 03/21/2021   10:50 AM 04/03/2019    7:39 PM  Advanced Directives  Does Patient Have a Medical Advance Directive? Yes Yes No  Type of Advance Directive Healthcare Power of Attorney    Does patient want to make changes to medical advance directive?  No - Patient declined   Copy of Healthcare Power of Attorney in Chart? No - copy requested      Current Medications (verified) Outpatient Encounter Medications as of 05/01/2024  Medication Sig   allopurinol  (ZYLOPRIM ) 100 MG tablet TAKE 2 TABLETS BY MOUTH DAILY. GENERIC EQUIVALENT FOR ZYLOPRIM    Colchicine  (MITIGARE ) 0.6 MG CAPS Take 0.6 mg by mouth daily.   glucosamine-chondroitin 500-400 MG tablet Take 1 tablet by mouth 3 (three) times daily.     Multiple Vitamins-Minerals (MULTIVITAMIN,TX-MINERALS) tablet Take 1 tablet by mouth daily.     niacin 500 MG tablet Take 500 mg by mouth 2 (two) times daily with a meal.   cephALEXin  (KEFLEX ) 500 MG capsule  Take 1 capsule (500 mg total) by mouth 4 (four) times daily.   tamsulosin  (FLOMAX ) 0.4 MG CAPS capsule Take 1 capsule (0.4 mg total) by mouth daily.   No facility-administered encounter medications on file as of 05/01/2024.    Allergies (verified) Sulfa antibiotics   History: Past Medical History:  Diagnosis Date   Blood transfusion without reported diagnosis    as a child   CKD (chronic kidney disease) stage 3, GFR 30-59 ml/min (HCC)    DDD (degenerative disc disease)    Gout    Nephrolithiasis    PMR (polymyalgia rheumatica) (HCC)    Ureteral obstruction    Past Surgical History:  Procedure Laterality Date   bone spur     foot   COLONOSCOPY     HERNIA REPAIR     KNEE SURGERY Right    SHOULDER SURGERY Bilateral    TRIGGER FINGER RELEASE     x4   URETER SURGERY     x3   Family History  Problem Relation Age of Onset   Colon cancer Neg Hx    Esophageal cancer Neg Hx    Rectal cancer Neg Hx    Stomach cancer Neg Hx    Social History   Socioeconomic History   Marital status: Married    Spouse name: Not on file   Number of children: Not on file   Years of education: Not on file   Highest education level: Some college, no  degree  Occupational History   Not on file  Tobacco Use   Smoking status: Never   Smokeless tobacco: Never  Vaping Use   Vaping status: Never Used  Substance and Sexual Activity   Alcohol use: Not Currently   Drug use: No   Sexual activity: Not on file  Other Topics Concern   Not on file  Social History Narrative   Not on file   Social Drivers of Health   Financial Resource Strain: Low Risk  (05/01/2024)   Overall Financial Resource Strain (CARDIA)    Difficulty of Paying Living Expenses: Not hard at all  Food Insecurity: No Food Insecurity (05/01/2024)   Hunger Vital Sign    Worried About Running Out of Food in the Last Year: Never true    Ran Out of Food in the Last Year: Never true  Transportation Needs: No Transportation Needs  (05/01/2024)   PRAPARE - Administrator, Civil Service (Medical): No    Lack of Transportation (Non-Medical): No  Physical Activity: Insufficiently Active (05/01/2024)   Exercise Vital Sign    Days of Exercise per Week: 3 days    Minutes of Exercise per Session: 30 min  Stress: No Stress Concern Present (05/01/2024)   [employer] of Occupational Health - Occupational Stress Questionnaire    Feeling of Stress: Not at all  Social Connections: Socially Integrated (05/01/2024)   Social Connection and Isolation Panel    Frequency of Communication with Friends and Family: More than three times a week    Frequency of Social Gatherings with Friends and Family: More than three times a week    Attends Religious Services: More than 4 times per year    Active Member of [employer] or Organizations: Yes    Attends Engineer, structural: More than 4 times per year    Marital Status: Married    Tobacco Counseling Counseling given: Not Answered   Clinical Intake:  Pre-visit preparation completed: Yes  Pain : 0-10 Pain Score: 5  Pain Type: Chronic pain Pain Location: Hand Pain Orientation: Right, Left Pain Descriptors / Indicators: Aching, Dull Pain Onset: More than a month ago Pain Frequency: Intermittent     Diabetes: No  How often do you need to have someone help you when you read instructions, pamphlets, or other written materials from your doctor or pharmacy?: 1 - Never  Interpreter Needed?: No  Information entered by :: Mliss Graff LPN   Activities of Daily Living    05/01/2024    9:09 AM  In your present state of health, do you have any difficulty performing the following activities:  Hearing? 0  Vision? 0  Difficulty concentrating or making decisions? 0  Walking or climbing stairs? 0  Dressing or bathing? 0  Doing errands, shopping? 0  Preparing Food and eating ? N  Using the Toilet? N  In the past six months, have you accidently leaked urine? N  Do  you have problems with loss of bowel control? N  Managing your Medications? N  Managing your Finances? N  Housekeeping or managing your Housekeeping? N    Patient Care Team: Duanne Butler DASEN, MD as PCP - General (Family Medicine)  Indicate any recent Medical Services you may have received from other than [facility] providers in the past year (date may be approximate).     Assessment:   This is a routine wellness examination for Tanish.  Hearing/Vision screen Hearing Screening - Comments:: No trouble hearing Vision Screening -  Comments:: Not up to date Education provided   Goals Addressed             This Visit's Progress    Patient Stated       Maintain current lifestyle       Depression Screen    05/01/2024    9:09 AM 02/11/2024    8:31 AM 06/12/2023    9:10 AM 06/06/2022   10:39 AM 03/21/2021   10:45 AM 06/09/2019    9:30 AM 06/09/2019    8:41 AM  PHQ 2/9 Scores  PHQ - 2 Score 0 0 0 0 0 0 0  PHQ- 9 Score 0 2         Fall Risk    05/01/2024    9:03 AM 02/11/2024    8:30 AM 06/12/2023    9:10 AM 03/21/2021   10:45 AM 06/09/2019    9:30 AM  Fall Risk   Falls in the past year? 0 0 0 0 0   Number falls in past yr: 0 0 0 0 0   Injury with Fall? 0 0 0 0 0  Risk for fall due to :  No Fall Risks No Fall Risks    Follow up Falls evaluation completed;Education provided;Falls prevention discussed Falls evaluation completed Falls prevention discussed Falls evaluation completed       Data saved with a previous flowsheet row definition    MEDICARE RISK AT HOME: Medicare Risk at Home Any stairs in or around the home?: Yes If so, are there any without handrails?: No Home free of loose throw rugs in walkways, pet beds, electrical cords, etc?: Yes Adequate lighting in your home to reduce risk of falls?: Yes Life alert?: No Use of a cane, walker or w/c?: No Grab bars in the bathroom?: Yes Shower chair or bench in shower?: Yes Elevated toilet seat or a handicapped toilet?:  No  TIMED UP AND GO:  Was the test performed?  No    Cognitive Function:        05/01/2024    9:06 AM  6CIT Screen  What Year? 0 points  What month? 0 points  What time? 0 points  Count back from 20 0 points  Months in reverse 0 points  Repeat phrase 0 points  Total Score 0 points    Immunizations Immunization History  Administered Date(s) Administered   Fluad Quad(high Dose 65+) 05/09/2019   INFLUENZA, HIGH DOSE SEASONAL PF 07/06/2017, 06/21/2018, 06/06/2022   Influenza-Unspecified 08/03/2016, 07/04/2017   Moderna Sars-Covid-2 Vaccination 11/21/2019, 12/24/2019, 07/22/2020   PNEUMOCOCCAL CONJUGATE-20 03/21/2021   Pneumococcal Polysaccharide-23 06/09/2019   Tdap 03/12/2018, 04/03/2019    TDAP status: Up to date  Flu Vaccine status: Due, Education has been provided regarding the importance of this vaccine. Advised may receive this vaccine at local pharmacy or Health Dept. Aware to provide a copy of the vaccination record if obtained from local pharmacy or Health Dept. Verbalized acceptance and understanding.  Pneumococcal vaccine status: Up to date  Covid-19 vaccine status: Information provided on how to obtain vaccines.   Qualifies for Shingles Vaccine? No   Zostavax completed Yes   Shingrix Completed?: Yes  Screening Tests Health Maintenance  Topic Date Due   Zoster Vaccines- Shingrix (1 of 2) Never done   COVID-19 Vaccine (4 - 2024-25 season) 05/06/2023   INFLUENZA VACCINE  04/04/2024   Medicare Annual Wellness (AWV)  05/01/2025   DTaP/Tdap/Td (3 - Td or Tdap) 04/02/2029   Colonoscopy  10/07/2029   Pneumococcal  Vaccine: 50+ Years  Completed   Hepatitis C Screening  Completed   HPV VACCINES  Aged Out   Meningococcal B Vaccine  Aged Out    Health Maintenance  Health Maintenance Due  Topic Date Due   Zoster Vaccines- Shingrix (1 of 2) Never done   COVID-19 Vaccine (4 - 2024-25 season) 05/06/2023   INFLUENZA VACCINE  04/04/2024    Colorectal cancer  screening: No longer required.   Lung Cancer Screening: (Low Dose CT Chest recommended if Age 41-80 years, 20 pack-year currently smoking OR have quit w/in 15years.) does not qualify.   Lung Cancer Screening Referral:   Additional Screening:  Hepatitis C Screening  does not qualify   Vision Screening: Recommended annual ophthalmology exams for early detection of glaucoma and other disorders of the eye. Is the patient up to date with their annual eye exam?  No  Who is the provider or what is the name of the office in which the patient attends annual eye exams? Education provided If pt is not established with a provider, would they like to be referred to a provider to establish care? No .   Dental Screening: Recommended annual dental exams for proper oral hygiene   Community Resource Referral / Chronic Care Management: CRR required this visit?  No   CCM required this visit?  No     Plan:     I have personally reviewed and noted the following in the patient's chart:   Medical and social history Use of alcohol, tobacco or illicit drugs  Current medications and supplements including opioid prescriptions. Patient is not currently taking opioid prescriptions. Functional ability and status Nutritional status Physical activity Advanced directives List of other physicians Hospitalizations, surgeries, and ER visits in previous 12 months Vitals Screenings to include cognitive, depression, and falls Referrals and appointments  In addition, I have reviewed and discussed with patient certain preventive protocols, quality metrics, and best practice recommendations. A written personalized care plan for preventive services as well as general preventive health recommendations were provided to patient.     Mliss Graff, LPN   1/71/7974   After Visit Summary: (MyChart) Due to this being a telephonic visit, the after visit summary with patients personalized plan was offered to patient via  MyChart   Nurse Notes:

## 2024-07-29 ENCOUNTER — Encounter: Admitting: Family Medicine

## 2024-08-05 ENCOUNTER — Encounter: Payer: Self-pay | Admitting: Family Medicine

## 2024-08-05 ENCOUNTER — Ambulatory Visit: Admitting: Family Medicine

## 2024-08-05 VITALS — BP 126/82 | HR 59 | Temp 97.6°F | Ht 67.0 in | Wt 179.8 lb

## 2024-08-05 DIAGNOSIS — N1832 Chronic kidney disease, stage 3b: Secondary | ICD-10-CM

## 2024-08-05 DIAGNOSIS — E7800 Pure hypercholesterolemia, unspecified: Secondary | ICD-10-CM

## 2024-08-05 DIAGNOSIS — Z0000 Encounter for general adult medical examination without abnormal findings: Secondary | ICD-10-CM

## 2024-08-05 DIAGNOSIS — Z125 Encounter for screening for malignant neoplasm of prostate: Secondary | ICD-10-CM

## 2024-08-05 NOTE — Progress Notes (Signed)
 Subjective:    Patient ID: David Lawson Stage, male    DOB: [birth year], 73 y.o.   MRN: [id number]  HPI Patient is a very pleasant 73 year old white male who comes in today for complete physical exam. He had a history of ureteral obstruction as a child that required surgery. Due to kidney damage he suffered as a child, he developed CKD as an adult.  This also caused  gout as an adult.  Patient's colonoscopy is up-to-date.  He is due for prostate cancer screening.  Patient's last colonoscopy was in 2021.  He would not be due again until 2031.  He is seeing a nephrologist every 6 months due to his chronic kidney disease.  Immunizations are up-to-date including the shingles vaccine and pneumonia vaccine.  He had his flu and COVID shot at a local pharmacy.  We did discuss RSV and he believes that he will proceed with that.  He had mild hyperlipidemia last year on his lab work however he is not currently taking a statin.  He does have a strong family history of heart disease in his father and his brother    Past Medical History:  Diagnosis Date   Blood transfusion without reported diagnosis    as a child   CKD (chronic kidney disease) stage 3, GFR 30-59 ml/min (HCC)    DDD (degenerative disc disease)    Gout    Nephrolithiasis    PMR (polymyalgia rheumatica)    Ureteral obstruction    Past Surgical History:  Procedure Laterality Date   bone spur     foot   COLONOSCOPY     HERNIA REPAIR     KNEE SURGERY Right    SHOULDER SURGERY Bilateral    TRIGGER FINGER RELEASE     x4   URETER SURGERY     x3   Current Outpatient Medications on File Prior to Visit  Medication Sig Dispense Refill   allopurinol  (ZYLOPRIM ) 100 MG tablet TAKE 2 TABLETS BY MOUTH DAILY. GENERIC EQUIVALENT FOR ZYLOPRIM  180 tablet 1   cephALEXin  (KEFLEX ) 500 MG capsule Take 1 capsule (500 mg total) by mouth 4 (four) times daily. 28 capsule 0   Colchicine  (MITIGARE ) 0.6 MG CAPS Take 0.6 mg by mouth daily. 30 capsule 3    glucosamine-chondroitin 500-400 MG tablet Take 1 tablet by mouth 3 (three) times daily.       Multiple Vitamins-Minerals (MULTIVITAMIN,TX-MINERALS) tablet Take 1 tablet by mouth daily.       niacin 500 MG tablet Take 500 mg by mouth 2 (two) times daily with a meal.     tamsulosin  (FLOMAX ) 0.4 MG CAPS capsule Take 1 capsule (0.4 mg total) by mouth daily. 30 capsule 3   No current facility-administered medications on file prior to visit.   Allergies  Allergen Reactions   Sulfa Antibiotics Hives    Unsure of reaction- as a child   Social History   Socioeconomic History   Marital status: Married    Spouse name: Not on file   Number of children: Not on file   Years of education: Not on file   Highest education level: Some college, no degree  Occupational History   Not on file  Tobacco Use   Smoking status: Never   Smokeless tobacco: Never  Vaping Use   Vaping status: Never Used  Substance and Sexual Activity   Alcohol use: Not Currently   Drug use: No   Sexual activity: Not on file  Other Topics Concern  Not on file  Social History Narrative   Not on file   Social Drivers of Health   Financial Resource Strain: Low Risk  (08/01/2024)   Overall Financial Resource Strain (CARDIA)    Difficulty of Paying Living Expenses: Not hard at all  Food Insecurity: No Food Insecurity (08/01/2024)   Hunger Vital Sign    Worried About Running Out of Food in the Last Year: Never true    Ran Out of Food in the Last Year: Never true  Transportation Needs: No Transportation Needs (08/01/2024)   PRAPARE - Administrator, Civil Service (Medical): No    Lack of Transportation (Non-Medical): No  Physical Activity: Unknown (08/01/2024)   Exercise Vital Sign    Days of Exercise per Week: 6 days    Minutes of Exercise per Session: Not on file  Stress: No Stress Concern Present (08/01/2024)   [employer] of Occupational Health - Occupational Stress Questionnaire    Feeling of  Stress: Not at all  Social Connections: Socially Integrated (08/01/2024)   Social Connection and Isolation Panel    Frequency of Communication with Friends and Family: More than three times a week    Frequency of Social Gatherings with Friends and Family: Three times a week    Attends Religious Services: More than 4 times per year    Active Member of Clubs or Organizations: Yes    Attends Banker Meetings: More than 4 times per year    Marital Status: Married  Catering Manager Violence: Not At Risk (05/01/2024)   Humiliation, Afraid, Rape, and Kick questionnaire    Fear of Current or Ex-Partner: No    Emotionally Abused: No    Physically Abused: No    Sexually Abused: No   Family history significant for father with coronary artery disease and a family history of coronary artery disease on his father's side.    Review of Systems  All other systems reviewed and are negative.      Objective:   Physical Exam Vitals reviewed.  Constitutional:      General: He is not in acute distress.    Appearance: Normal appearance. He is normal weight. He is not ill-appearing, toxic-appearing or diaphoretic.  HENT:     Head: Normocephalic and atraumatic.     Right Ear: Tympanic membrane, ear canal and external ear normal. There is no impacted cerumen.     Left Ear: Tympanic membrane, ear canal and external ear normal. There is no impacted cerumen.     Nose: Nose normal. No congestion.     Mouth/Throat:     Mouth: Mucous membranes are moist.     Pharynx: Oropharynx is clear. No oropharyngeal exudate.  Eyes:     Extraocular Movements: Extraocular movements intact.     Conjunctiva/sclera: Conjunctivae normal.     Pupils: Pupils are equal, round, and reactive to light.  Neck:     Vascular: No carotid bruit.  Cardiovascular:     Rate and Rhythm: Normal rate and regular rhythm.     Pulses: Normal pulses.     Heart sounds: Normal heart sounds. No murmur heard.    No friction rub.  No gallop.  Pulmonary:     Effort: Pulmonary effort is normal. No respiratory distress.     Breath sounds: Normal breath sounds. No stridor. No wheezing, rhonchi or rales.  Abdominal:     General: Abdomen is flat. Bowel sounds are normal. There is no distension.     Tenderness:  There is no abdominal tenderness. There is no guarding or rebound.  Musculoskeletal:     Cervical back: Normal range of motion and neck supple. No rigidity or tenderness.  Lymphadenopathy:     Cervical: No cervical adenopathy.  Skin:    General: Skin is warm.     Coloration: Skin is not jaundiced.     Findings: No bruising, erythema or lesion.  Neurological:     General: No focal deficit present.     Mental Status: He is alert and oriented to person, place, and time. Mental status is at baseline.     Cranial Nerves: No cranial nerve deficit.     Sensory: No sensory deficit.     Motor: No weakness.     Coordination: Coordination normal.     Gait: Gait normal.     Deep Tendon Reflexes: Reflexes normal.  Psychiatric:        Mood and Affect: Mood normal.        Behavior: Behavior normal.        Thought Content: Thought content normal.        Judgment: Judgment normal.           Assessment & Plan:  Pure hypercholesterolemia - Plan: CT CARDIAC SCORING (SELF PAY ONLY), CBC with Differential/Platelet, Comprehensive metabolic panel with GFR, Lipid panel  Prostate cancer screening - Plan: PSA  General medical exam  Stage 3b chronic kidney disease (HCC) Defer management of his chronic kidney disease to his nephrologist who he sees later this week.  Colonoscopy is up-to-date.  Screening for prostate cancer with a PSA.  Recommended RSV vaccine.  Check CBC CMP and lipid panel.  Based on his family history we have decided to perform a coronary artery calcium score and if significantly elevated greater than 70th percentile I would recommend taking a statin

## 2024-08-06 LAB — CBC WITH DIFFERENTIAL/PLATELET
Absolute Lymphocytes: 1717 {cells}/uL (ref 850–3900)
Absolute Monocytes: 545 {cells}/uL (ref 200–950)
Basophils Absolute: 38 {cells}/uL (ref 0–200)
Basophils Relative: 0.7 %
Eosinophils Absolute: 259 {cells}/uL (ref 15–500)
Eosinophils Relative: 4.8 %
HCT: 40.9 % (ref 39.4–51.1)
Hemoglobin: 13.8 g/dL (ref 13.2–17.1)
MCH: 33 pg (ref 27.0–33.0)
MCHC: 33.7 g/dL (ref 31.6–35.4)
MCV: 97.8 fL (ref 81.4–101.7)
MPV: 10.5 fL (ref 7.5–12.5)
Monocytes Relative: 10.1 %
Neutro Abs: 2840 {cells}/uL (ref 1500–7800)
Neutrophils Relative %: 52.6 %
Platelets: 187 Thousand/uL (ref 140–400)
RBC: 4.18 Million/uL — ABNORMAL LOW (ref 4.20–5.80)
RDW: 13.1 % (ref 11.0–15.0)
Total Lymphocyte: 31.8 %
WBC: 5.4 Thousand/uL (ref 3.8–10.8)

## 2024-08-06 LAB — COMPREHENSIVE METABOLIC PANEL WITH GFR
AG Ratio: 2 (calc) (ref 1.0–2.5)
ALT: 20 U/L (ref 9–46)
AST: 22 U/L (ref 10–35)
Albumin: 4.4 g/dL (ref 3.6–5.1)
Alkaline phosphatase (APISO): 86 U/L (ref 35–144)
BUN/Creatinine Ratio: 16 (calc) (ref 6–22)
BUN: 35 mg/dL — ABNORMAL HIGH (ref 7–25)
CO2: 26 mmol/L (ref 20–32)
Calcium: 9.5 mg/dL (ref 8.6–10.3)
Chloride: 107 mmol/L (ref 98–110)
Creat: 2.18 mg/dL — ABNORMAL HIGH (ref 0.70–1.28)
Globulin: 2.2 g/dL (ref 1.9–3.7)
Glucose, Bld: 93 mg/dL (ref 65–99)
Potassium: 5.3 mmol/L (ref 3.5–5.3)
Sodium: 142 mmol/L (ref 135–146)
Total Bilirubin: 0.5 mg/dL (ref 0.2–1.2)
Total Protein: 6.6 g/dL (ref 6.1–8.1)
eGFR: 31 mL/min/1.73m2 — ABNORMAL LOW (ref >=60)

## 2024-08-06 LAB — LIPID PANEL
Cholesterol: 157 mg/dL (ref <200)
HDL: 41 mg/dL (ref >=40)
LDL Cholesterol (Calc): 98 mg/dL
Non-HDL Cholesterol (Calc): 116 mg/dL (ref <130)
Total CHOL/HDL Ratio: 3.8 (calc) (ref <5.0)
Triglycerides: 90 mg/dL (ref <150)

## 2024-08-06 LAB — PSA: PSA: 3.89 ng/mL (ref <=4.00)

## 2024-08-07 ENCOUNTER — Ambulatory Visit: Payer: Self-pay | Admitting: Family Medicine

## 2024-08-12 ENCOUNTER — Telehealth: Payer: Self-pay

## 2024-08-12 NOTE — Telephone Encounter (Signed)
 Copied from CRM #[id number]. Topic: Clinical - Lab/Test Results >> Aug 12, 2024  2:31 PM Joesph B wrote: Reason for CRM: results relayed.

## 2024-09-03 ENCOUNTER — Ambulatory Visit (HOSPITAL_COMMUNITY)
Admission: RE | Admit: 2024-09-03 | Discharge: 2024-09-03 | Disposition: A | Discharge status: Home / Self Care | Payer: Self-pay | Source: Ambulatory Visit | Attending: Family Medicine | Admitting: Family Medicine

## 2024-09-03 DIAGNOSIS — E7800 Pure hypercholesterolemia, unspecified: Secondary | ICD-10-CM | POA: Insufficient documentation

## 2024-09-12 ENCOUNTER — Other Ambulatory Visit: Payer: Self-pay | Admitting: Family Medicine

## 2024-09-12 DIAGNOSIS — M1A0610 Idiopathic chronic gout, right knee, without tophus (tophi): Secondary | ICD-10-CM

## 2024-09-12 DIAGNOSIS — M25561 Pain in right knee: Secondary | ICD-10-CM

## 2024-09-17 ENCOUNTER — Other Ambulatory Visit: Payer: Self-pay | Admitting: Family Medicine

## 2024-09-17 DIAGNOSIS — M25561 Pain in right knee: Secondary | ICD-10-CM

## 2024-09-17 DIAGNOSIS — M1A0610 Idiopathic chronic gout, right knee, without tophus (tophi): Secondary | ICD-10-CM

## 2024-09-17 NOTE — Telephone Encounter (Signed)
 Copied from CRM [id number]. Topic: Clinical - Medication Refill >> Sep 17, 2024 11:19 AM Zebedee SAUNDERS wrote: Medication: allopurinol  (ZYLOPRIM ) 100 MG tablet  Has the patient contacted their pharmacy? Yes (Agent: If no, request that the patient contact the pharmacy for the refill. If patient does not wish to contact the pharmacy document the reason why and proceed with request.) (Agent: If yes, when and what did the pharmacy advise?)Pharmacy need script  This is the patient's preferred pharmacy:  [id number] - [place], [place] - [address] AT [facility] OF [address] [facility] [address] [place] [address] Phone: [phone] Fax: [phone]  Is this the correct pharmacy for this prescription? Yes If no, delete pharmacy and type the correct one.   Has the prescription been filled recently? Yes  Is the patient out of the medication? Yes  Has the patient been seen for an appointment in the last year OR does the patient have an upcoming appointment? Yes  Can we respond through MyChart? Yes  Agent: Please be advised that Rx refills may take up to 3 business days. We ask that you follow-up with your pharmacy.

## 2024-09-18 MED ORDER — ALLOPURINOL 100 MG PO TABS: ORAL_TABLET | ORAL | 1 refills | Status: AC

## 2024-09-18 NOTE — Telephone Encounter (Signed)
 New pharmacy. Requested Prescriptions  Pending Prescriptions Disp Refills   allopurinol  (ZYLOPRIM ) 100 MG tablet 180 tablet 1    Sig: TAKE 2 TABLETS BY MOUTH DAILY. GENERIC EQUIVALENT FOR ZYLOPRIM      Endocrinology:  Gout Agents - allopurinol  Failed - 09/18/2024 12:41 PM      Failed - Uric Acid in normal range and within 360 days    Uric Acid, Serum  Date Value Ref Range Status  05/09/2019 5.2 4.0 - 8.0 mg/dL Final    Comment:    Therapeutic target for gout patients: <6.0 mg/dL .          Failed - Cr in normal range and within 360 days    Creat  Date Value Ref Range Status  08/05/2024 2.18 (H) 0.70 - 1.28 mg/dL Final   Creatinine, POC  Date Value Ref Range Status  11/10/2022 102.9 mg/dL Final    Comment:    abstracted by HIM   Creatinine, Urine  Date Value Ref Range Status  06/06/2022 74 20 - 320 mg/dL Final         Passed - Valid encounter within last 12 months    Recent Outpatient Visits           1 month ago Pure hypercholesterolemia   [place] [facility] Pickard, Butler DASEN, MD   7 months ago UTI symptoms   [place] [facility] Duanne Butler DASEN, MD   1 year ago Stage 3b chronic kidney disease [facility])   [place] [facility] Duanne Butler DASEN, MD   1 year ago Encounter for staple removal   [place] [facility] Duanne Butler DASEN, MD   2 years ago Stage 3b chronic kidney disease [facility])    [place] [facility] Pickard, Butler DASEN, MD              Passed - CBC within normal limits and completed in the last 12 months    WBC  Date Value Ref Range Status  08/05/2024 5.4 3.8 - 10.8 Thousand/uL Final   RBC  Date Value Ref Range Status  08/05/2024 4.18 (L) 4.20 - 5.80 Million/uL Final   Hemoglobin  Date Value Ref Range Status  08/05/2024 13.8 13.2 - 17.1 g/dL Final   HCT  Date Value Ref Range Status  08/05/2024 40.9 39.4 - 51.1 % Final   MCHC  Date Value Ref  Range Status  08/05/2024 33.7 31.6 - 35.4 g/dL Final    Comment:    For adults, a slight decrease in the calculated MCHC value (in the range of 30 to 32 g/dL) is most likely not clinically significant; however, it should be interpreted with caution in correlation with other red cell parameters and the patient's clinical condition.    [facility]  Date Value Ref Range Status  08/05/2024 33.0 27.0 - 33.0 pg Final   MCV  Date Value Ref Range Status  08/05/2024 97.8 81.4 - 101.7 fL Final   No results found for: PLTCOUNTKUC, LABPLAT, POCPLA RDW  Date Value Ref Range Status  08/05/2024 13.1 11.0 - 15.0 % Final

## 2025-02-03 ENCOUNTER — Other Ambulatory Visit

## 2025-05-07 ENCOUNTER — Encounter

## 2025-08-07 ENCOUNTER — Encounter: Admitting: Family Medicine
